# Patient Record
Sex: Female | Born: 1965 | Hispanic: Refuse to answer | Marital: Married | Smoking: Never smoker
Health system: Southern US, Community
[De-identification: ages and names within clinical notes are randomized; demographics above are authoritative.]

## PROBLEM LIST (undated history)

## (undated) DIAGNOSIS — Z87442 Personal history of urinary calculi: Secondary | ICD-10-CM

## (undated) DIAGNOSIS — L309 Dermatitis, unspecified: Secondary | ICD-10-CM

## (undated) DIAGNOSIS — L83 Acanthosis nigricans: Secondary | ICD-10-CM

## (undated) DIAGNOSIS — J069 Acute upper respiratory infection, unspecified: Secondary | ICD-10-CM

## (undated) DIAGNOSIS — K5900 Constipation, unspecified: Secondary | ICD-10-CM

## (undated) DIAGNOSIS — E785 Hyperlipidemia, unspecified: Secondary | ICD-10-CM

## (undated) DIAGNOSIS — T4145XA Adverse effect of unspecified anesthetic, initial encounter: Secondary | ICD-10-CM

## (undated) DIAGNOSIS — N946 Dysmenorrhea, unspecified: Secondary | ICD-10-CM

## (undated) DIAGNOSIS — N201 Calculus of ureter: Secondary | ICD-10-CM

## (undated) DIAGNOSIS — E11628 Type 2 diabetes mellitus with other skin complications: Secondary | ICD-10-CM

## (undated) DIAGNOSIS — J45909 Unspecified asthma, uncomplicated: Secondary | ICD-10-CM

## (undated) DIAGNOSIS — R112 Nausea with vomiting, unspecified: Secondary | ICD-10-CM

## (undated) DIAGNOSIS — R519 Headache, unspecified: Secondary | ICD-10-CM

## (undated) DIAGNOSIS — I2510 Atherosclerotic heart disease of native coronary artery without angina pectoris: Secondary | ICD-10-CM

## (undated) DIAGNOSIS — R51 Headache: Secondary | ICD-10-CM

## (undated) DIAGNOSIS — R05 Cough: Principal | ICD-10-CM

## (undated) HISTORY — DX: Cough: R05

## (undated) HISTORY — DX: Hyperlipidemia, unspecified: E78.5

## (undated) HISTORY — DX: Calculus of ureter: N20.1

## (undated) HISTORY — DX: Acute upper respiratory infection, unspecified: J06.9

## (undated) HISTORY — DX: Dysmenorrhea, unspecified: N94.6

## (undated) HISTORY — DX: Atherosclerotic heart disease of native coronary artery without angina pectoris: I25.10

---

## 2004-03-28 ENCOUNTER — Emergency Department (HOSPITAL_COMMUNITY): Admission: EM | Admit: 2004-03-28 | Discharge: 2004-03-28 | Payer: Self-pay | Admitting: Family Medicine

## 2004-06-20 ENCOUNTER — Emergency Department (HOSPITAL_COMMUNITY): Admission: EM | Admit: 2004-06-20 | Discharge: 2004-06-20 | Payer: Self-pay | Admitting: Family Medicine

## 2004-06-20 ENCOUNTER — Ambulatory Visit (HOSPITAL_COMMUNITY): Admission: RE | Admit: 2004-06-20 | Discharge: 2004-06-20 | Payer: Self-pay | Admitting: Family Medicine

## 2004-09-06 ENCOUNTER — Emergency Department (HOSPITAL_COMMUNITY): Admission: EM | Admit: 2004-09-06 | Discharge: 2004-09-06 | Payer: Self-pay | Admitting: Family Medicine

## 2004-09-10 ENCOUNTER — Emergency Department (HOSPITAL_COMMUNITY): Admission: EM | Admit: 2004-09-10 | Discharge: 2004-09-10 | Payer: Self-pay | Admitting: Family Medicine

## 2005-03-11 ENCOUNTER — Encounter: Admission: RE | Admit: 2005-03-11 | Discharge: 2005-03-11 | Payer: Self-pay | Admitting: Family Medicine

## 2010-07-16 ENCOUNTER — Ambulatory Visit (INDEPENDENT_AMBULATORY_CARE_PROVIDER_SITE_OTHER): Payer: BC Managed Care – PPO | Admitting: Internal Medicine

## 2010-07-16 ENCOUNTER — Encounter: Payer: Self-pay | Admitting: Internal Medicine

## 2010-07-16 VITALS — BP 126/84 | HR 86 | Temp 98.2°F | Ht 64.0 in | Wt 248.4 lb

## 2010-07-16 DIAGNOSIS — R05 Cough: Secondary | ICD-10-CM

## 2010-07-16 MED ORDER — BENZONATATE 100 MG PO CAPS: ORAL_CAPSULE | ORAL | Status: DC

## 2010-07-16 MED ORDER — HYDROCOD POLST-CHLORPHEN POLST 10-8 MG/5ML PO LQCR: 5.0000 mL | Freq: Two times a day (BID) | ORAL | Status: DC

## 2010-07-16 NOTE — Patient Instructions (Signed)
Your cough is likely due to post viral reactive cough, sinus issues, acid reflux and cyclical cough Do  Not take advair For acid reflux - take zegerid 20mg  cap once daily on empty stomach, follow diet sheet (no cola, no spices, no caffeine, no alcohol, no chocolates, no pizza, no fries) For sinus  - take qnasal samples  2 squirts each nostril daily For cyclical cough - take instruction seet and follow it. Use tussionex Followup - 4-6 weeks

## 2010-07-16 NOTE — Progress Notes (Signed)
Subjective:    Patient ID: Tammy Velez, female    DOB: 10/21/1965, 45 y.o.   MRN: [id number]  Cough This is a new (45 year old obese lady. Referred for cough since april 2012) problem. Episode onset: Got sick easter wkeened with cold. 5 days later went to  PMD with cough  - barking, big "bahooka" cough. Got IM steroids and antibiotics. After that it partially resolved 50%. Then in May 2012 (early part) cough got worse and really bad. The problem has been unchanged (Got 2nd round levaquin and prednisone for 7-10 days. CXR early may 2012 repoirtedly clear. Also took advair [employer] day but did not help). The problem occurs hourly. The cough is non-productive (Mostly dry but occ mild brown sputum. Describes barking cough. Rates it as severe. Initially cough bothered during sleep but not anymore). Associated symptoms include heartburn, nasal congestion, rhinorrhea and shortness of breath. Pertinent negatives include no chest pain, chills, ear congestion, ear pain, eye redness, fever, headaches, hemoptysis, myalgias, postnasal drip, rash, sore throat, sweats, weight loss or wheezing. Associated symptoms comments: She sometimes feels like she has to take a deep breath. Feels chest tightness after walking and with cough. Associated occ. feeling of tickle in throat +. Also feels spasm in chest just before cough. Also, occ heartburn since onset of cough. Socially embarrassing at work. Needs to talk a lot at work. Exacerbated by: laughing, talking, activity, walking. Risk factors for lung disease include smoking/tobacco exposure and occupational exposure (Works for [employer] x 2.5 years. 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 She has continued to work during this time. Socially embarrasing). She has tried oral steroids, steroid inhaler, a beta-agonist inhaler, OTC cough suppressant and prescription cough suppressant (has hycodan cough syrup) for the symptoms. The treatment  provided no relief. Her past medical history is significant for bronchitis. There is no history of asthma, bronchiectasis, COPD, emphysema, environmental allergies or pneumonia. Maternal aunt with asthma. Moved here from [place] 2006. Since then fall/winter bronchitis with residual post viral cough that would last 4 weeks. Known  to have eczema but no allergy hx      Review of Systems  Constitutional: Negative for fever, chills, weight loss and unexpected weight change.  HENT: Positive for rhinorrhea. Negative for ear pain, nosebleeds, congestion, sore throat, sneezing, trouble swallowing, dental problem, postnasal drip and sinus pressure.   Eyes: Negative for redness and itching.  Respiratory: Positive for cough and shortness of breath. Negative for hemoptysis, chest tightness and wheezing.   Cardiovascular: Negative for chest pain, palpitations and leg swelling.  Gastrointestinal: Positive for heartburn. Negative for nausea and vomiting.  Genitourinary: Negative for dysuria.  Musculoskeletal: Negative for myalgias and joint swelling.  Skin: Negative for rash.  Neurological: Negative for headaches.  Hematological: Negative for environmental allergies. Does not bruise/bleed easily.  Psychiatric/Behavioral: Negative for dysphoric mood. The patient is not nervous/anxious.        Objective:   Physical Exam  [vitalsreviewed. Constitutional: She is oriented to person, place, and time. She appears well-developed and well-nourished. No distress.       Obese Coughs a lot  HENT:  Head: Normocephalic and atraumatic.  Right Ear: External ear normal.  Left Ear: External ear normal.  Mouth/Throat: Oropharynx is clear and moist. No oropharyngeal exudate.  Eyes: Conjunctivae and EOM are normal. Pupils are equal, round, and reactive to light. Right eye exhibits no discharge. Left eye exhibits no discharge. No scleral icterus.  Neck:  Normal range of motion. Neck supple. No JVD present. No tracheal  deviation present. No thyromegaly present.  Cardiovascular: Normal rate, regular rhythm, normal heart sounds and intact distal pulses.  Exam reveals no gallop and no friction rub.   No murmur heard. Pulmonary/Chest: Effort normal and breath sounds normal. No respiratory distress. She has no wheezes. She has no rales. She exhibits no tenderness.       Coughs with deep breath But no wheeze  Abdominal: Soft. Bowel sounds are normal. She exhibits no distension and no mass. There is no tenderness. There is no rebound and no guarding.  Musculoskeletal: Normal range of motion. She exhibits no edema and no tenderness.  Lymphadenopathy:    She has no cervical adenopathy.  Neurological: She is alert and oriented to person, place, and time. She has normal reflexes. No cranial nerve deficit. She exhibits normal muscle tone. Coordination normal.  Skin: Skin is warm and dry. No rash noted. She is not diaphoretic. No erythema. No pallor.  Psychiatric: She has a normal mood and affect. Her behavior is normal. Judgment and thought content normal.          Assessment & Plan:

## 2010-07-21 ENCOUNTER — Encounter: Payer: Self-pay | Admitting: Internal Medicine

## 2010-07-21 DIAGNOSIS — R05 Cough: Secondary | ICD-10-CM | POA: Insufficient documentation

## 2010-07-21 DIAGNOSIS — R059 Cough, unspecified: Secondary | ICD-10-CM

## 2010-07-21 HISTORY — DX: Cough, unspecified: R05.9

## 2010-07-21 NOTE — Assessment & Plan Note (Signed)
Your cough is likely due to post viral reactive cough, sinus issues, acid reflux and cyclical cough To get rid of it, you have to be 100% compliant with instructions and be patient Do  Not take advair For acid reflux - take zegerid 20mg  cap once daily on empty stomach, follow diet sheet (no cola, no spices, no caffeine, no alcohol, no chocolates, no pizza, no fries) For sinus  - take qnasal samples  2 squirts each nostril daily For cyclical cough - take instruction sheet and follow it. Use tussionex Followup - 4-6 weeks

## 2010-07-23 ENCOUNTER — Telehealth: Payer: Self-pay | Admitting: *Deleted

## 2010-07-23 NOTE — Telephone Encounter (Signed)
error 

## 2010-08-27 ENCOUNTER — Ambulatory Visit: Payer: BC Managed Care – PPO | Admitting: Internal Medicine

## 2010-09-11 ENCOUNTER — Ambulatory Visit (INDEPENDENT_AMBULATORY_CARE_PROVIDER_SITE_OTHER): Payer: BC Managed Care – PPO | Admitting: Internal Medicine

## 2010-09-11 ENCOUNTER — Encounter: Payer: Self-pay | Admitting: Internal Medicine

## 2010-09-11 VITALS — BP 120/80 | HR 104 | Temp 98.9°F | Ht 64.0 in | Wt 253.6 lb

## 2010-09-11 DIAGNOSIS — R05 Cough: Secondary | ICD-10-CM

## 2010-09-11 MED ORDER — GABAPENTIN 300 MG PO CAPS: ORAL_CAPSULE | ORAL | Status: DC

## 2010-09-11 NOTE — Progress Notes (Signed)
Subjective:    Patient ID: Tammy Velez, female    DOB: 1965/05/13, 45 y.o.   MRN: [id number]  HPI Followup multifactorial cough (post viral, sinus, GERD, cyclical cough).  This is 2 months fu afer her first visit when multimodal Rx was started including our cyclical cough protocol. STates that voice rest helped cough 50% but relapsed 2 weeks later so she followed with another voice rest that again helped 50% for another week or so but now coughing back at baseline. Wakes up at night due to cough. Dry cough.  Severity is moderate but occasionally severe. She notices talking at work in [employer] makes cough worse. Voice rest clearly helps. IN addition, zegerid has helped partially esp with heartburn and stomach issues. Also notices that if and when she follows gerd diet it can help somewhat but she is largely non compliant with anti gerd diet. Uses qnasal all along but finally ran out last week; does not think this helped.   Koufman Cough Reflux Index Score is 31 and fits in with LPR cough (hoarse voice, clearing throat, post nasal drip, cough worse lyng down, lump in throat, annoying couh and heartburn)  Review of past hx: reveals hx of eczema in past REview of social: continues to work at [employer]. She is trying to addres with boss about reassignment to a job with less talking Review of family hx: no change  Past Medical History  Diagnosis Date  . Cough      Family History  Problem Relation Age of Onset  . Heart disease Mother     a-fib and MVP     History   Social History  . Marital Status: Married    Spouse Name: N/A    Number of Children: 2  . Years of Education: N/A   Occupational History  . department manager    Social History Main Topics  . Smoking status: Never Smoker   . Smokeless tobacco: Not on file  . Alcohol Use: 1.0 oz/week    2 drink(s) per week  . Drug Use: No  . Sexually Active: Not on file   Other Topics Concern  . Not on file    Social History Narrative  . No narrative on file     No Known Allergies   Outpatient Prescriptions Prior to Visit  Medication Sig Dispense Refill  . omeprazole-sodium bicarbonate (ZEGERID) 40-1100 MG per capsule Take 1 capsule by mouth daily before breakfast.        . chlorpheniramine-HYDROcodone (TUSSIONEX PENNKINETIC ER) 10-8 MG/5ML LQCR Take 5 mLs by mouth every 12 (twelve) hours.  140 mL  0  . NON FORMULARY Place 2 sprays into the nose daily. qNasal       . benzonatate (TESSALON PERLES) 100 MG capsule Take 1-2 tablets every 4 hours  30 capsule  1       Review of Systems  Constitutional: Negative for fever and unexpected weight change.  HENT: Negative for ear pain, nosebleeds, congestion, sore throat, rhinorrhea, sneezing, trouble swallowing, dental problem, postnasal drip and sinus pressure.   Eyes: Negative for redness and itching.  Respiratory: Positive for cough. Negative for chest tightness, shortness of breath and wheezing.   Cardiovascular: Negative for palpitations and leg swelling.  Gastrointestinal: Negative for nausea and vomiting.  Genitourinary: Negative for dysuria.  Musculoskeletal: Negative for joint swelling.  Skin: Negative for rash.  Neurological: Negative for headaches.  Hematological: Does not bruise/bleed easily.  Psychiatric/Behavioral: Negative for dysphoric mood. The patient is  not nervous/anxious.        Objective:   Physical Exam  Vitals reviewed. Constitutional: She is oriented to person, place, and time. She appears well-developed and well-nourished. No distress.       obese  HENT:  Head: Normocephalic and atraumatic.  Right Ear: External ear normal.  Left Ear: External ear normal.  Mouth/Throat: Oropharynx is clear and moist. No oropharyngeal exudate.  Eyes: Conjunctivae and EOM are normal. Pupils are equal, round, and reactive to light. Right eye exhibits no discharge. Left eye exhibits no discharge. No scleral icterus.  Neck: Normal  range of motion. Neck supple. No JVD present. No tracheal deviation present. No thyromegaly present.  Cardiovascular: Normal rate, regular rhythm, normal heart sounds and intact distal pulses.  Exam reveals no gallop and no friction rub.   No murmur heard. Pulmonary/Chest: Effort normal and breath sounds normal. No respiratory distress. She has no wheezes. She has no rales. She exhibits no tenderness.  Abdominal: Soft. Bowel sounds are normal. She exhibits no distension and no mass. There is no tenderness. There is no rebound and no guarding.  Musculoskeletal: Normal range of motion. She exhibits no edema and no tenderness.  Lymphadenopathy:    She has no cervical adenopathy.  Neurological: She is alert and oriented to person, place, and time. She has normal reflexes. No cranial nerve deficit. She exhibits normal muscle tone. Coordination normal.  Skin: Skin is warm and dry. No rash noted. She is not diaphoretic. No erythema. No pallor.  Psychiatric: She has a normal mood and affect. Her behavior is normal. Judgment and thought content normal.          Assessment & Plan:

## 2010-09-11 NOTE — Patient Instructions (Addendum)
-  Cough is from sinus drainage, possible asthma,  possible acid reflux and all conspiring to cause cyclical cough/LPR cough -Need to rule out asthma #Sinus drainage  - start  netti pot daily or atleast 3 times per week: take picture of this from my nurse #Possible Acid Reflux  - continue zegerid 20mg  capsule daily on empty stomach   - if you do not have diet sheet anymore , take diet sheet from [place] - avoid colas, spices, cheeses, spirits, red meats, beer, chocolates, fried foods etc.,   - sleep with head end of bed elevated  - eat small frequent meals  - do not go to bed for 3 hours after last meal #Possible Asthma   - do methacholine challenge test to rule out asthma #Cyclical cough  - will refer you to speech therapy with Mr Verdie Mosher -You can and should try voice rest as and when possible including additional 3 days  - At any time there (not just the 3 days of voice rest but anytime) there is urge to cough, drink water or swallow or sip on throat lozenge  - start neurontin 300mg  po daily  X 3 days, then 300mg  po twice daily x 3 days, then 300mg  po three times daily to continue. If this makes you sleepy, cut down on dose but you should continue neurontin at all times #Followup - 5 weeks r come sooner

## 2010-09-11 NOTE — Assessment & Plan Note (Signed)
-  Cough is from sinus drainage, possible asthma,  possible acid reflux and all conspiring to cause cyclical cough/LPR cough. Score 31, improvement with voice rest and anti-gerd measures and, worsening with talking and other features all fit in with LPR -Need to rule out asthma due to persistence and hx of eczema in past  PLAN #Sinus drainage  - start  netti pot daily or atleast 3 times per week: take picture of this from my nurse - stop qnasal due to lack of perceived beneffit #Possible Acid Reflux (reemphasized)  - continue zegerid 20mg  capsule daily on empty stomach   - if you do not have diet sheet anymore , take diet sheet from [place] - avoid colas, spices, cheeses, spirits, red meats, beer, chocolates, fried foods etc.,   - sleep with head end of bed elevated  - eat small frequent meals  - do not go to bed for 3 hours after last meal #Possible Asthma   - do methacholine challenge test to rule out asthma #Cyclical cough  - will refer you to speech therapy with Mr Verdie Mosher -You can and should try voice rest as and when possible including additional 3 days  - At any time there (not just the 3 days of voice rest but anytime) there is urge to cough, drink water or swallow or sip on throat lozenge  - start neurontin 300mg  po daily  X 3 days, then 300mg  po twice daily x 3 days, then 300mg  po three times daily to continue. If this makes you sleepy, cut down on dose but you should continue neurontin at all times #Followup - 5 weeks

## 2010-10-16 ENCOUNTER — Ambulatory Visit: Payer: BC Managed Care – PPO | Admitting: Internal Medicine

## 2010-10-28 ENCOUNTER — Emergency Department (HOSPITAL_COMMUNITY): Payer: BC Managed Care – PPO

## 2010-10-28 ENCOUNTER — Observation Stay (HOSPITAL_COMMUNITY)
Admission: AD | Admit: 2010-10-28 | Discharge: 2010-10-29 | Disposition: A | Discharge status: Home / Self Care | Payer: BC Managed Care – PPO | Source: Other Acute Inpatient Hospital | Attending: Urology | Admitting: Urology

## 2010-10-28 ENCOUNTER — Emergency Department (HOSPITAL_COMMUNITY)
Admission: EM | Admit: 2010-10-28 | Discharge: 2010-10-28 | Disposition: A | Discharge status: Still In Hospital | Payer: BC Managed Care – PPO | Source: Home / Self Care | Attending: Emergency Medicine | Admitting: Emergency Medicine

## 2010-10-28 DIAGNOSIS — N201 Calculus of ureter: Secondary | ICD-10-CM | POA: Insufficient documentation

## 2010-10-28 DIAGNOSIS — R109 Unspecified abdominal pain: Secondary | ICD-10-CM | POA: Insufficient documentation

## 2010-10-28 DIAGNOSIS — N1330 Unspecified hydronephrosis: Secondary | ICD-10-CM | POA: Insufficient documentation

## 2010-10-28 DIAGNOSIS — E669 Obesity, unspecified: Secondary | ICD-10-CM | POA: Insufficient documentation

## 2010-10-28 DIAGNOSIS — R110 Nausea: Secondary | ICD-10-CM | POA: Insufficient documentation

## 2010-10-28 DIAGNOSIS — N200 Calculus of kidney: Secondary | ICD-10-CM | POA: Insufficient documentation

## 2010-10-28 HISTORY — PX: OTHER SURGICAL HISTORY: SHX169

## 2010-10-28 LAB — URINE MICROSCOPIC-ADD ON

## 2010-10-28 LAB — CBC
Hemoglobin: 12.8 g/dL (ref 12.0–15.0)
MCH: 30.8 pg (ref 26.0–34.0)
MCHC: 34 g/dL (ref 30.0–36.0)
MCV: 90.8 fL (ref 78.0–100.0)
Platelets: 321 10*3/uL (ref 150–400)

## 2010-10-28 LAB — DIFFERENTIAL
Eosinophils Absolute: 0.3 10*3/uL (ref 0.0–0.7)
Eosinophils Relative: 3 % (ref 0–5)
Lymphocytes Relative: 36 % (ref 12–46)
Lymphs Abs: 3.8 10*3/uL (ref 0.7–4.0)
Monocytes Relative: 7 % (ref 3–12)

## 2010-10-28 LAB — URINALYSIS, ROUTINE W REFLEX MICROSCOPIC
Nitrite: POSITIVE — AB
Protein, ur: 100 mg/dL — AB
Specific Gravity, Urine: 1.028 (ref 1.005–1.030)
pH: 6 (ref 5.0–8.0)

## 2010-10-28 LAB — BASIC METABOLIC PANEL
BUN: 18 mg/dL (ref 6–23)
Chloride: 102 mEq/L (ref 96–112)
GFR calc Af Amer: >60 mL/min (ref >60)
Glucose, Bld: 157 mg/dL — ABNORMAL HIGH (ref 70–99)
Sodium: 138 mEq/L (ref 135–145)

## 2010-11-03 NOTE — Op Note (Signed)
  NAMECHELE, CORNELL NO.:  [id number]  MEDICAL RECORD NO.:  [id number]  LOCATION:  1403                         FACILITY:  [facility]  PHYSICIAN:  Valetta Fuller, MD    DATE OF BIRTH:  1965-02-06  DATE OF PROCEDURE:  10/28/2010 DATE OF DISCHARGE:                              OPERATIVE REPORT   PREOPERATIVE DIAGNOSIS:  Right proximal ureteral calculus with probable concurrent urinary tract infection.  POSTOPERATIVE DIAGNOSIS:  Right proximal ureteral calculus with probable concurrent urinary tract infection.  PROCEDURE PERFORMED:  Cystoscopy with right retrograde pyelogram and right double-J stent placement.  SURGEON:  Valetta Fuller, MD  ANESTHESIA:  General.  INDICATIONS:  Ms. Bicking is 45 years of age.  She has had multiple episodes of clinical stone events, but has never required any surgical intervention.  She presented with typical right-sided renal colic.  CT imaging revealed some bilateral renal calculi in a 3 x 5 mm proximal right ureteral stone.  The patient was afebrile and nontoxic in appearance, but did have a urinalysis suspicious for concurrent UTI along with a mildly elevated white blood cell count.  We discussed this with the emergency room physician at [facility] and felt that the prudent thing would be to place double-J stent to assure adequate drainage of the kidney and reduce the risk of developing urosepsis.  We felt that definitive stone management would need to be delayed for another setting.  This was explained to the patient and she was transferred to [facility] Operating Room where again we discussed things with her and her husband.  Our plan is to temporize her situation with double-J stent to continue on antibiotic therapy until cultures are pending.  If she does well clinically, she will be discharged in the morning with definitive stone management in the later date.  TECHNIQUE AND FINDINGS:  The patient was  brought to the operating room where she had successful induction of general anesthesia.  She was placed in the lithotomy position and prepped and draped in the usual manner.  The patient had already received Rocephin IV.  Appropriate surgical time-out was performed.  Cystoscopy was unremarkable.  Right retrograde pyelogram was performed, which demonstrated a filling defect in the proximal ureter with mild obstruction.  A guidewire was placed in right renal pelvis with fluoroscopic guidance.  A 24 cm 6-French double-J stent was then placed.  Good positioning was confirmed and the patient was brought to recovery room in stable condition having had no obvious complications or problems.     Valetta Fuller, MD     DSG/MEDQ  D:  10/28/2010  T:  10/29/2010  Job:  [id number]  Electronically Signed by Barron Alvine M.D. on 11/03/2010 06:48:50 PM

## 2010-11-13 ENCOUNTER — Ambulatory Visit (INDEPENDENT_AMBULATORY_CARE_PROVIDER_SITE_OTHER): Payer: BC Managed Care – PPO | Admitting: Internal Medicine

## 2010-11-13 ENCOUNTER — Encounter: Payer: Self-pay | Admitting: Internal Medicine

## 2010-11-13 VITALS — BP 130/86 | HR 100 | Temp 98.2°F | Ht 64.0 in | Wt 246.4 lb

## 2010-11-13 DIAGNOSIS — R05 Cough: Secondary | ICD-10-CM

## 2010-11-13 DIAGNOSIS — Z23 Encounter for immunization: Secondary | ICD-10-CM

## 2010-11-13 NOTE — Patient Instructions (Addendum)
-  Cough is from sinus drainage, possible asthma,  e acid reflux and all conspiring to cause cyclical cough/LPR cough -Need to rule out asthma - Glad you are  Better - your cough score dropped to 9.5 - Unsure how but glad antibiotics helped - Have flu shot today #Sinus drainage  - continue  netti pot as much as poosible for sinus - I know you do not like it #Acid Reflux  - definitely causative reason for cough  - continue zegerid 20mg  capsule daily on empty stomach   -- avoid colas, spices, cheeses, spirits, red meats, beer, chocolates, fried foods etc.,   - sleep with head end of bed elevated  - eat small frequent meals  - do not go to bed for 3 hours after last meal - follow the diet sheet #Possible Asthma   - do methacholine challenge test to rule out asthma #Cyclical cough  - since you are better without speech therapy and neurontin we can just follow along and see how you do  - so no need for neurontin and speech therapy for now    #Followup - depending on methacholine challenge test

## 2010-11-13 NOTE — Assessment & Plan Note (Signed)
-  Cough is from definite sinus drainage, definite acid reflux and all conspiring to cause cyclical cough/LPR cough - Glad you are  Better - your cough score dropped to 9.5 - Unsure how but glad antibiotics helped  - Still need to rule out asthma - Have flu shot today  #Sinus drainage  - continue  netti pot as much as poosible for sinus - I know you do not like it #Acid Reflux  - definitely causative reason for cough  - continue zegerid 20mg  capsule daily on empty stomach   -- avoid colas, spices, cheeses, spirits, red meats, beer, chocolates, fried foods etc.,   - sleep with head end of bed elevated  - eat small frequent meals  - do not go to bed for 3 hours after last meal - follow the diet sheet #Possible Asthma   - do methacholine challenge test to rule out asthma #Cyclical cough  - since you are better without speech therapy and neurontin we can just follow along and see how you do  - so no need for neurontin and speech therapy for now    #Followup - depending on methacholine challenge test - if normal, dc from scheduled fu. If positive for asthma, then start ICS and then fu  She is agreeable with plan

## 2010-11-13 NOTE — Progress Notes (Signed)
Subjective:    Patient ID: Tammy Velez, female    DOB: 1965/08/21, 45 y.o.   MRN: [id number]  HPI Followup multifactorial cough (post viral, sinus, GERD, cyclical cough).  IOV 09/11/10: This is 2 months fu afer her first visit when multimodal Rx was started including our cyclical cough protocol. STates that voice rest helped cough 50% but relapsed 2 weeks later so she followed with another voice rest that again helped 50% for another week or so but now coughing back at baseline. Wakes up at night due to cough. Dry cough.  Severity is moderate but occasionally severe. She notices talking at work in [employer] makes cough worse. Voice rest clearly helps. IN addition, zegerid has helped partially esp with heartburn and stomach issues. Also notices that if and when she follows gerd diet it can help somewhat but she is largely non compliant with anti gerd diet. Uses qnasal all along but finally ran out last week; does not think this helped.   Koufman Cough Reflux Index Score is 31 and fits in with LPR cough (hoarse voice, clearing throat, post nasal drip, cough worse lyng down, lump in throat, annoying couh and heartburn)  Review of past hx: reveals hx of eczema in past REview of social: continues to work at [employer]. She is trying to addres with boss about reassignment to a job with less talking Review of family hx: no change  FOLLOWUP: -Cough is from sinus drainage, possible asthma,  possible acid reflux and all conspiring to cause cyclical cough/LPR cough -Need to rule out asthma #Sinus drainage  - start  netti pot daily or atleast 3 times per week: take picture of this from my nurse #Possible Acid Reflux  - continue zegerid 20mg  capsule daily on empty stomach   - if you do not have diet sheet anymore , take diet sheet from [place] - avoid colas, spices, cheeses, spirits, red meats, beer, chocolates, fried foods etc.,   - sleep with head end of bed elevated  - eat small frequent  meals  - do not go to bed for 3 hours after last meal #Possible Asthma   - do methacholine challenge test to rule out asthma #Cyclical cough  - will refer you to speech therapy with Mr Verdie Mosher -You can and should try voice rest as and when possible including additional 3 days  - At any time there (not just the 3 days of voice rest but anytime) there is urge to cough, drink water or swallow or sip on throat lozenge  - start neurontin 300mg  po daily  X 3 days, then 300mg  po twice daily x 3 days, then 300mg  po three times daily to continue. If this makes you sleepy, cut down on dose but you should continue neurontin at all times #Followup - 5 weeks or come sooner   OV 11/13/10: . She was taking neurontin and this was helping but was unable to titrate dose due to feeling "depressed". Then several weeks into Rx (10/29/2010) got admitted for uti, ureteral obstn s/p stent and got antibiotics. AFter this cough improved significantly. She feels antibiotics helped improve cough. Now only mild cough. Cough now improved to 9.5 in the RSI Kouffman score. Has only mild hoarseness, clearing of throat, excess mucus, post nasal drop and senssation of lump in throat. Moderate cough after lying down. Cough definitely associated with post nasal drip (though not doing netti pot) GERD and worsens when she does not take PPI. Of note she  did not undergo speech therapy and methacholine challenge but she states she was never called (epic review shows order placed).  Review of Systems Review of Systems  Constitutional: Negative for fever and unexpected weight change.  HENT: Negative for ear pain, nosebleeds, congestion, sore throat, rhinorrhea, sneezing, trouble swallowing, dental problem, postnasal drip and sinus pressure.   Eyes: Negative for redness and itching.  Respiratory: Positive for cough that has improved. Negative for chest tightness, shortness of breath and wheezing.   Cardiovascular: Negative for  palpitations and leg swelling.  Gastrointestinal: Negative for nausea and vomiting.  Genitourinary: Negative for dysuria.  Musculoskeletal: Negative for joint swelling.  Skin: Negative for rash.  Neurological: Negative for headaches.  Hematological: Does not bruise/bleed easily.  Psychiatric/Behavioral: Negative for dysphoric mood. The patient is not nervous/anxious.       Objective:   Physical Exam Vitals reviewed. Constitutional: She is oriented to person, place, and time. She appears well-developed and well-nourished. No distress.       obese Coughs periodically  HENT:  Head: Normocephalic and atraumatic.  Right Ear: External ear normal.  Left Ear: External ear normal.  Mouth/Throat: Oropharynx is clear and moist. No oropharyngeal exudate.  Eyes: Conjunctivae and EOM are normal. Pupils are equal, round, and reactive to light. Right eye exhibits no discharge. Left eye exhibits no discharge. No scleral icterus.  Neck: Normal range of motion. Neck supple. No JVD present. No tracheal deviation present. No thyromegaly present.  Cardiovascular: Normal rate, regular rhythm, normal heart sounds and intact distal pulses.  Exam reveals no gallop and no friction rub.   No murmur heard. Pulmonary/Chest: Effort normal and breath sounds normal. No respiratory distress. She has no wheezes. She has no rales. She exhibits no tenderness.  Abdominal: Soft. Bowel sounds are normal. She exhibits no distension and no mass. There is no tenderness. There is no rebound and no guarding.  Musculoskeletal: Normal range of motion. She exhibits no edema and no tenderness.  Lymphadenopathy:    She has no cervical adenopathy.  Neurological: She is alert and oriented to person, place, and time. She has normal reflexes. No cranial nerve deficit. She exhibits normal muscle tone. Coordination normal.  Skin: Skin is warm and dry. No rash noted. She is not diaphoretic. No erythema. No pallor.  Psychiatric: She has a  normal mood and affect. Her behavior is normal. Judgment and thought content normal.          Assessment & Plan:

## 2010-11-18 ENCOUNTER — Ambulatory Visit (HOSPITAL_COMMUNITY)
Admission: RE | Admit: 2010-11-18 | Discharge: 2010-11-18 | Disposition: A | Discharge status: Home / Self Care | Payer: BC Managed Care – PPO | Source: Ambulatory Visit | Attending: Internal Medicine | Admitting: Internal Medicine

## 2010-11-18 DIAGNOSIS — R05 Cough: Secondary | ICD-10-CM

## 2010-11-18 DIAGNOSIS — R059 Cough, unspecified: Secondary | ICD-10-CM | POA: Insufficient documentation

## 2010-11-22 ENCOUNTER — Telehealth: Payer: Self-pay | Admitting: Internal Medicine

## 2010-11-22 NOTE — Telephone Encounter (Signed)
Called, spoke with pt.  She is requesting the results of Methacholine challenge test done on Oct 15.  I did explain to pt these results can take up to 2 wks to be ready and informed her I would send message to MR to see if they are available yet but he is not back in the office until Monday.  She verbalized understanding and is ok with this.  MR, have you seen these results?  Pls advise.  Thanks!

## 2010-11-24 NOTE — Telephone Encounter (Signed)
It is either not faxed or in my 2 bags of papers. Please have them fax and give it to me in pm when I am in office

## 2010-11-25 NOTE — Telephone Encounter (Signed)
Methacholine challenbge test 11/18/10 positive for asthma. Make fu 1st avail to discuss

## 2010-11-25 NOTE — Telephone Encounter (Signed)
Results received and placed in Mr's to do basket on Side B.

## 2010-11-25 NOTE — Telephone Encounter (Signed)
Called, spoke with Marcelino Duster with Resp Dept.  She will fax results to triage.

## 2010-11-25 NOTE — Discharge Summary (Signed)
  Tammy Velez, RIESEN NO.:  [id number]  MEDICAL RECORD NO.:  [id number]  LOCATION:  1403                         FACILITY:  [facility]  PHYSICIAN:  Valetta Fuller, MD    DATE OF BIRTH:  [birth year]  DATE OF ADMISSION:  10/28/2010 DATE OF DISCHARGE:  10/29/2010                              DISCHARGE SUMMARY   DISCHARGE DIAGNOSIS:  Ureteral calculus.  HISTORY OF PRESENT ILLNESS:  [name] is 45 years of age and has had multiple clinical stone events.  She presented with right-sided renal colic and a CT showed bilateral renal calculi as well as a 5-mm proximal right ureteral stone.  The patient's urinalysis was suspicious for potential concurrent urinary tract infection, although she did not display any evidence of urosepsis.  She did have a mildly elevated white blood cell count.  The patient had initially presented to [facility].  We felt that the most prudent thing to do was to transfer to [facility] and to place a double-J stent.  Right double-J stent was placed on October 28, 2010.  The patient was kept overnight for observation.  She had no evidence of fever, urosepsis, or any other clinical problems.  DISPOSITION:  The patient was discharged to home.  There were no changes in her medications with the exception of the addition of Vesicare as well as some Septra.  The patient will be following up in our office in 10-14 days.     Valetta Fuller, MD     DSG/MEDQ  D:  11/20/2010  T:  11/20/2010  Job:  [id number]  Electronically Signed by Barron Alvine M.D. on 11/25/2010 10:30:10 AM

## 2010-11-26 ENCOUNTER — Encounter: Payer: Self-pay | Admitting: Internal Medicine

## 2010-11-26 ENCOUNTER — Ambulatory Visit (INDEPENDENT_AMBULATORY_CARE_PROVIDER_SITE_OTHER): Payer: BC Managed Care – PPO | Admitting: Internal Medicine

## 2010-11-26 VITALS — BP 138/88 | HR 88 | Temp 98.7°F | Ht 64.0 in | Wt 243.4 lb

## 2010-11-26 DIAGNOSIS — R059 Cough, unspecified: Secondary | ICD-10-CM

## 2010-11-26 DIAGNOSIS — R05 Cough: Secondary | ICD-10-CM

## 2010-11-26 MED ORDER — BECLOMETHASONE DIPROPIONATE 80 MCG/ACT IN AERS: 1.0000 | INHALATION_SPRAY | RESPIRATORY_TRACT | Status: DC | PRN

## 2010-11-26 NOTE — Telephone Encounter (Signed)
Pt is scheduled to come in today at 1:45 to see MR to discuss these results

## 2010-11-26 NOTE — Progress Notes (Signed)
Addended by: Ozella Almond R on: 11/26/2010 02:28 PM   Modules accepted: Orders

## 2010-11-26 NOTE — Patient Instructions (Signed)
-  Cough is from sinus drainage, acid reflux and ASTHMA as seen on methacholine challenge test #Sinus drainage  - continue  netti pot as much as poosible for sinus - I know you do not like it #Acid Reflux  - definitely causative reason for cough  - continue zegerid 20mg  capsule daily on empty stomach   -- avoid colas, spices, cheeses, spirits, red meats, beer, chocolates, fried foods etc.,   - sleep with head end of bed elevated  - eat small frequent meals  - do not go to bed for 3 hours after last meal - follow the diet sheet # Asthma   - START QVAR 2 puff twice daily - take sample, show technique - Use albuterol 2 puff as needed; show tecqnique #Followup -2 months or sooner if needed

## 2010-11-26 NOTE — Progress Notes (Signed)
Subjective:    Patient ID: Tammy Velez, female    DOB: September 11, 1965, 45 y.o.   MRN: [id number]  HPI Followup multifactorial cough (post viral, sinus, GERD, cyclical cough).  IOV 09/11/10: This is 2 months fu afer her first visit when multimodal Rx was started including our cyclical cough protocol. STates that voice rest helped cough 50% but relapsed 2 weeks later so she followed with another voice rest that again helped 50% for another week or so but now coughing back at baseline. Wakes up at night due to cough. Dry cough.  Severity is moderate but occasionally severe. She notices talking at work in [employer] makes cough worse. Voice rest clearly helps. IN addition, zegerid has helped partially esp with heartburn and stomach issues. Also notices that if and when she follows gerd diet it can help somewhat but she is largely non compliant with anti gerd diet. Uses qnasal all along but finally ran out last week; does not think this helped.   Koufman Cough Reflux Index Score is 31 and fits in with LPR cough (hoarse voice, clearing throat, post nasal drip, cough worse lyng down, lump in throat, annoying couh and heartburn)   OV 11/13/10: cough score improved to 9.5. Methacholine challenge ordered  OV 11/25/10: Fu after methacholine challenge test: TEST done this month is strongly poisitive for asthma. Brough on all symptoms. No new symptomatology. RSI cough score is 16. She is wondering if moving to [place] made her develop asthma.    No change to past, family, social hx  Review of Systems  Constitutional: Negative for fever and unexpected weight change.  HENT: Negative for ear pain, nosebleeds, congestion, sore throat, rhinorrhea, sneezing, trouble swallowing, dental problem, postnasal drip and sinus pressure.   Eyes: Negative for redness and itching.  Respiratory: Positive for cough and shortness of breath. Negative for chest tightness and wheezing.   Cardiovascular: Negative for  palpitations and leg swelling.  Gastrointestinal: Negative for nausea and vomiting.  Genitourinary: Negative for dysuria.  Musculoskeletal: Negative for joint swelling.  Skin: Negative for rash.  Neurological: Negative for headaches.  Hematological: Does not bruise/bleed easily.  Psychiatric/Behavioral: Negative for dysphoric mood. The patient is not nervous/anxious.        Objective:   Physical Exam Vitals reviewed. Constitutional: She is oriented to person, place, and time. She appears well-developed and well-nourished. No distress.       obese Coughs periodically  HENT:  Head: Normocephalic and atraumatic.  Right Ear: External ear normal.  Left Ear: External ear normal.  Mouth/Throat: Oropharynx is clear and moist. No oropharyngeal exudate.  Eyes: Conjunctivae and EOM are normal. Pupils are equal, round, and reactive to light. Right eye exhibits no discharge. Left eye exhibits no discharge. No scleral icterus.  Neck: Normal range of motion. Neck supple. No JVD present. No tracheal deviation present. No thyromegaly present.  Cardiovascular: Normal rate, regular rhythm, normal heart sounds and intact distal pulses.  Exam reveals no gallop and no friction rub.   No murmur heard. Pulmonary/Chest: Effort normal and breath sounds normal. No respiratory distress. She has no wheezes. She has no rales. She exhibits no tenderness.  Abdominal: Soft..  Musculoskeletal: Normal range of motion. She exhibits no edema and no tenderness.  Lymphadenopathy:    She has no cervical adenopathy.  Neurological: She is alert and oriented to person, place, and time. She has normal reflexes. No cranial nerve deficit. She exhibits normal muscle tone. Coordination normal.  Skin: Skin is warm and dry.  No rash noted. She is not diaphoretic. No erythema. No pallor.  Psychiatric: She has a normal mood and affect. Her behavior is normal. Judgment and thought content normal.          Assessment & Plan:

## 2010-11-26 NOTE — Assessment & Plan Note (Signed)
Cough is from sinus drainage, acid reflux and ASTHMA as seen on methacholine challenge test #Sinus drainage  - continue  netti pot as much as poosible for sinus - I know you do not like it #Acid Reflux  - definitely causative reason for cough  - continue zegerid 20mg  capsule daily on empty stomach   -- avoid colas, spices, cheeses, spirits, red meats, beer, chocolates, fried foods etc.,   - sleep with head end of bed elevated  - eat small frequent meals  - do not go to bed for 3 hours after last meal - follow the diet sheet # Asthma   - START QVAR 2 puff twice daily - take sample, show technique - Use albuterol 2 puff as needed; show tecqnique #Followup -2 months or sooner if needed

## 2010-12-24 ENCOUNTER — Telehealth: Payer: Self-pay | Admitting: Internal Medicine

## 2010-12-24 NOTE — Telephone Encounter (Signed)
Spoke with Tammy Velez at rehab and she advised appointment had not been scheduled yet and will d/c attempt to schedule. LMOM that rehab has been contacted and advised of MR recs. Nothing further needed at this time.

## 2010-12-24 NOTE — Telephone Encounter (Signed)
Yes that is correct. Please apologize for not removing that order from computer system; on my behalf

## 2010-12-24 NOTE — Telephone Encounter (Signed)
Pt states since original order was placed in Aug12 she has been dx with Asthma and that MR told her the speech therapy is no longer needed. Pt is no longer interested in going. Advised she spoke with Dot Lanes and that # is [phone]. MR please advise if you are okay with this.

## 2011-01-27 ENCOUNTER — Telehealth: Payer: Self-pay | Admitting: *Deleted

## 2011-01-27 NOTE — Telephone Encounter (Signed)
Pt r/s to 02-11-10 at 3:45pm. Carron Curie, CMA

## 2011-01-27 NOTE — Telephone Encounter (Signed)
I LMTCB to r/s the pt appt on 01-30-11 due to MR canceling office. If Tammy Earlene Plater or Victorino Dike is not in the office, please see Lawson Fiscal and have her r/s the patient. She is aware of available slots. Thanks. Carron Curie, CMA

## 2011-01-30 ENCOUNTER — Ambulatory Visit: Payer: BC Managed Care – PPO | Admitting: Internal Medicine

## 2011-02-12 ENCOUNTER — Ambulatory Visit: Payer: BC Managed Care – PPO | Admitting: Internal Medicine

## 2011-03-10 ENCOUNTER — Encounter: Payer: Self-pay | Admitting: Adult Health

## 2011-03-10 ENCOUNTER — Ambulatory Visit (INDEPENDENT_AMBULATORY_CARE_PROVIDER_SITE_OTHER): Payer: BC Managed Care – PPO | Admitting: Adult Health

## 2011-03-10 VITALS — BP 132/84 | HR 100 | Temp 98.0°F | Ht 64.0 in | Wt 255.6 lb

## 2011-03-10 DIAGNOSIS — J069 Acute upper respiratory infection, unspecified: Secondary | ICD-10-CM

## 2011-03-10 HISTORY — DX: Acute upper respiratory infection, unspecified: J06.9

## 2011-03-10 MED ORDER — AZITHROMYCIN 250 MG PO TABS: ORAL_TABLET | ORAL | Status: AC

## 2011-03-10 MED ORDER — HYDROCODONE-HOMATROPINE 5-1.5 MG/5ML PO SYRP: 5.0000 mL | ORAL_SOLUTION | Freq: Four times a day (QID) | ORAL | Status: AC | PRN

## 2011-03-10 NOTE — Patient Instructions (Signed)
Zpack take as directed.  Mucinex DM Twice daily  As needed   Fluids and rest  Hydromet 1-2 tsp every 4 hr As needed  Cough- may make you sleepy.  Please contact office for sooner follow up if symptoms do not improve or worsen or seek emergency care  follow up Dr. Ramaswamy in 6-8 weeks and As needed    

## 2011-03-10 NOTE — Progress Notes (Signed)
Subjective:    Patient ID: Tammy Velez, female    DOB: 02-Oct-1965, 46 y.o.   MRN: [id number]  HPI 46 yo female with known hx of multifactorial cough (post viral, sinus, GERD, cyclical cough).  IOV 09/11/10: This is 2 months fu afer her first visit when multimodal Rx was started including our cyclical cough protocol. STates that voice rest helped cough 50% but relapsed 2 weeks later so she followed with another voice rest that again helped 50% for another week or so but now coughing back at baseline. Wakes up at night due to cough. Dry cough.  Severity is moderate but occasionally severe. She notices talking at work in [employer] makes cough worse. Voice rest clearly helps. IN addition, zegerid has helped partially esp with heartburn and stomach issues. Also notices that if and when she follows gerd diet it can help somewhat but she is largely non compliant with anti gerd diet. Uses qnasal all along but finally ran out last week; does not think this helped.   Koufman Cough Reflux Index Score is 31 and fits in with LPR cough (hoarse voice, clearing throat, post nasal drip, cough worse lyng down, lump in throat, annoying couh and heartburn)   OV 11/13/10: cough score improved to 9.5. Methacholine challenge ordered  OV 11/25/10: Fu after methacholine challenge test: TEST done this month is strongly poisitive for asthma. Brough on all symptoms. No new symptomatology. RSI cough score is 16. She is wondering if moving to [place] made her develop asthma.   03/10/2011 Acute OV  Complains of productive cough with yellow mucus, wheezing, increased SOB, low grade temp x 5 days. Doing well until last few days. OTC not working. Cough is getting worse. Keeping her up at night. No hemoptysis or fever. No chest pain or edema.  No new meds or travel.   Review of Systems Constitutional:   No  weight loss, night sweats,  Fevers, chills, fatigue, or  lassitude.  HEENT:   No headaches,  Difficulty swallowing,   Tooth/dental problems, or  Sore throat,                No sneezing, itching, ear ache, + nasal congestion, post nasal drip,   CV:  No chest pain,  Orthopnea, PND, swelling in lower extremities, anasarca, dizziness, palpitations, syncope.   GI  No heartburn, indigestion, abdominal pain, nausea, vomiting, diarrhea, change in bowel habits, loss of appetite, bloody stools.   Resp:   No coughing up of blood.    No chest wall deformity  Skin: no rash or lesions.  GU: no dysuria, change in color of urine, no urgency or frequency.  No flank pain, no hematuria   MS:  No joint pain or swelling.  No decreased range of motion.  No back pain.  Psych:  No change in mood or affect. No depression or anxiety.  No memory loss.         Objective:   Physical Exam GEN: A/Ox3; pleasant , NAD, well nourished   HEENT:  [place]/AT,  EACs-clear, TMs-wnl, NOSE-clear drainage , THROAT-clear, no lesions, no postnasal drip or exudate noted.   NECK:  Supple w/ fair ROM; no JVD; normal carotid impulses w/o bruits; no thyromegaly or nodules palpated; no lymphadenopathy.  RESP  Coarse BS w/ barking cough,  w/o, wheezes/ rales/ or rhonchi.no accessory muscle use, no dullness to percussion  CARD:  RRR, no m/r/g  , no peripheral edema, pulses intact, no cyanosis or clubbing.  GI:  Soft & nt; nml bowel sounds; no organomegaly or masses detected.  Musco: Warm bil, no deformities or joint swelling noted.   Neuro: alert, no focal deficits noted.    Skin: Warm, no lesions or rashes         Assessment & Plan:

## 2011-03-10 NOTE — Assessment & Plan Note (Signed)
Zpack take as directed.  Mucinex DM Twice daily  As needed   Fluids and rest  Hydromet 1-2 tsp every 4 hr As needed  Cough- may make you sleepy.  Please contact office for sooner follow up if symptoms do not improve or worsen or seek emergency care  follow up Dr. Marchelle Gearing in 6-8 weeks and As needed

## 2011-04-21 ENCOUNTER — Ambulatory Visit: Payer: BC Managed Care – PPO | Admitting: Internal Medicine

## 2011-04-23 ENCOUNTER — Encounter: Payer: Self-pay | Admitting: Internal Medicine

## 2011-04-23 ENCOUNTER — Ambulatory Visit (INDEPENDENT_AMBULATORY_CARE_PROVIDER_SITE_OTHER): Payer: BC Managed Care – PPO | Admitting: Internal Medicine

## 2011-04-23 VITALS — BP 124/82 | HR 83 | Temp 97.2°F | Ht 64.0 in | Wt 257.0 lb

## 2011-04-23 DIAGNOSIS — R05 Cough: Secondary | ICD-10-CM

## 2011-04-23 DIAGNOSIS — R059 Cough, unspecified: Secondary | ICD-10-CM

## 2011-04-23 MED ORDER — FLUTICASONE-SALMETEROL 115-21 MCG/ACT IN AERO: 2.0000 | INHALATION_SPRAY | Freq: Two times a day (BID) | RESPIRATORY_TRACT | Status: DC

## 2011-04-23 MED ORDER — PREDNISONE 10 MG PO TABS: ORAL_TABLET | ORAL | Status: DC

## 2011-04-23 MED ORDER — FLUTICASONE PROPIONATE 50 MCG/ACT NA SUSP: 2.0000 | Freq: Every day | NASAL | Status: DC

## 2011-04-23 MED ORDER — ALBUTEROL SULFATE HFA 108 (90 BASE) MCG/ACT IN AERS
2.0000 | INHALATION_SPRAY | Freq: Four times a day (QID) | RESPIRATORY_TRACT | Status: DC | PRN
Start: 2011-04-23 — End: 2012-04-22

## 2011-04-23 NOTE — Progress Notes (Signed)
Subjective:    Patient ID: Tammy Velez, female    DOB: 12/05/1965, 46 y.o.   MRN: [id number]  HPI 46 yo female with known hx of multifactorial cough (post viral, sinus, GERD, cyclical cough).  IOV 09/11/10: This is 2 months fu afer her first visit when multimodal Rx was started including our cyclical cough protocol. STates that voice rest helped cough 50% but relapsed 2 weeks later so she followed with another voice rest that again helped 50% for another week or so but now coughing back at baseline. Wakes up at night due to cough. Dry cough.  Severity is moderate but occasionally severe. She notices talking at work in [employer] makes cough worse. Voice rest clearly helps. IN addition, zegerid has helped partially esp with heartburn and stomach issues. Also notices that if and when she follows gerd diet it can help somewhat but she is largely non compliant with anti gerd diet. Uses qnasal all along but finally ran out last week; does not think this helped.   Koufman Cough Reflux Index Score is 31 and fits in with LPR cough (hoarse voice, clearing throat, post nasal drip, cough worse lyng down, lump in throat, annoying couh and heartburn)   OV 11/13/10: cough score improved to 9.5. Methacholine challenge ordered  OV 11/25/10: Fu after methacholine challenge test: TEST done this month is strongly poisitive for asthma. Brought on all symptoms. No new symptomatology. RSI cough score is 16. She is wondering if moving to [place] made her develop asthma.   REC  -Cough is from sinus drainage, acid reflux and ASTHMA as seen on methacholine challenge test #Sinus drainage  - continue  netti pot as much as poosible for sinus - I know you do not like it #Acid Reflux  - definitely causative reason for cough  - continue zegerid 20mg  capsule daily on empty stomach   -- avoid colas, spices, cheeses, spirits, red meats, beer, chocolates, fried foods etc.,   - sleep with head end of bed elevated  - eat  small frequent meals  - do not go to bed for 3 hours after last meal - follow the diet sheet # Asthma   - START QVAR 2 puff twice daily - take sample, show technique - Use albuterol 2 puff as needed; show tecqnique #Followup -2 months or sooner if neede  03/10/2011 Acute OV  Complains of productive cough with yellow mucus, wheezing, increased SOB, low grade temp x 5 days. Doing well until last few days. OTC not working. Cough is getting worse. Keeping her up at night. No hemoptysis or fever. No chest pain or edema.  No new meds or travel.    REC Zpack take as directed.  Mucinex DM Twice daily As needed  Fluids and rest  Hydromet 1-2 tsp every 4 hr As needed Cough- may make you sleepy.  Please contact office for sooner follow up if symptoms do not improve or worsen or seek emergency care  follow up Dr. Marchelle Gearing in 6-8 weeks and As needed    OV 04/23/2011 Followup chronic cough on basis of methacholine proven asthma, GERD and LPR  She saw NP 6 weeks ago for acute bronchitis symptoms and treated with zpak. No prednisone Rx. Since then cough not better. Coughs all the time. Feels chest is congested and tight and she needs to take a deep breath. GAgs +. Feels onset of pollen season is resulting in dry eyes, sneezing and runny nose. Post nasal drip, clearing of throat,  and scratchy throat +. Denies fever, wheeze, edema. RSI cough score is worse and 33 and c/w  LPR cough (Level 5 - post nasal drip, annoying cough. Level 4 - clearing of throat, sensation of something in trhoat. Level 3 - hoarseness of voice, choking episodes, and heartburn. 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 Needs to talk a lot.   Current outpatient prescriptions:beclomethasone (QVAR) 80 MCG/ACT inhaler, Inhale 1 puff into the lungs as needed., Disp: 1 Inhaler, Rfl: 12;  omeprazole-sodium bicarbonate (ZEGERID) 40-1100 MG per capsule, Take 1 capsule  by mouth daily before breakfast.  , Disp: , Rfl:     Review of Systems  Constitutional: Negative for fever and unexpected weight change.  HENT: Negative for ear pain, nosebleeds, congestion, sore throat, rhinorrhea, sneezing, trouble swallowing, dental problem, postnasal drip and sinus pressure.   Eyes: Negative for redness and itching.  Respiratory: Positive for cough. Negative for chest tightness, shortness of breath and wheezing.   Cardiovascular: Negative for palpitations and leg swelling.  Gastrointestinal: Negative for nausea and vomiting.  Genitourinary: Negative for dysuria.  Musculoskeletal: Negative for joint swelling.  Skin: Negative for rash.  Neurological: Negative for headaches.  Hematological: Does not bruise/bleed easily.  Psychiatric/Behavioral: Negative for dysphoric mood. The patient is not nervous/anxious.        Objective:   Physical Exam GEN: A/Ox3; pleasant , NAD, well nourished .  Body mass index is 44.11 kg/(m^2).   HEENT:  [place]/AT,  EACs-clear, TMs-wnl, NOSE-clear drainage , THROAT-clear, no lesions, MILD postnasal drip +   NECK:  Supple w/ fair ROM; no JVD; normal carotid impulses w/o bruits; no thyromegaly or nodules palpated; no lymphadenopathy.  RESP  Coarse BS w/ barking cough,  w/o, wheezes/ rales/ or rhonchi.no accessory muscle use, no dullness to percussion  CARD:  RRR, no m/r/g  , no peripheral edema, pulses intact, no cyanosis or clubbing.  GI:   Soft & nt; nml bowel sounds; no organomegaly or masses detected.  Musco: Warm bil, no deformities or joint swelling noted.   Neuro: alert, no focal deficits noted.    Skin: Warm, no lesions or rashes         Assessment & Plan:

## 2011-04-23 NOTE — Patient Instructions (Addendum)
-  Cough is from sinus drainage, acid reflux and ASTHMA - The sinus and asthma appear to be acting up ; we call this exacerbation  #Sinus drainage  -  take generic fluticasone inhaler 2 squirts each nostril daily; take script - if this is not enough add nightly chlorpheniramine 4-8mg  ; can make you very dry  #Acid Reflux  - continue zegerid 20mg  capsule daily on empty stomach   -- avoid colas, spices, cheeses, spirits, red meats, beer, chocolates, fried foods etc.,   - sleep with head end of bed elevated  - eat small frequent meals  - do not go to bed for 3 hours after last meal - follow the diet sheet # Asthma   - Please take Take prednisone 40mg  once daily x 3 days, then 30mg  once daily x 3 days, then 20mg  once daily x 3 days, then prednisone 10mg  once daily  x 3 days and stop - stop qvar - STart Advair HFA 115/21 2 puff twice daily, -  take sample, show technique. Rinse mouth after use - Use albuterol 2 puff as needed; show tecqnique #Followup -4-6 weeks or sooner if needed - at followup do cough score

## 2011-04-23 NOTE — Assessment & Plan Note (Signed)
Cough clearly worse and likely due to post viral reactive cough, LPR cough, sinus drainage and possibly asthma worsening  PLAN -Cough is from sinus drainage, acid reflux and ASTHMA - The sinus and asthma appear to be acting up ; we call this exacerbation  #Sinus drainage  -  take generic fluticasone inhaler 2 squirts each nostril daily; take script - if this is not enough add nightly chlorpheniramine 4-8mg  ; can make you very dry  #Acid Reflux  - continue zegerid 20mg  capsule daily on empty stomach   -- avoid colas, spices, cheeses, spirits, red meats, beer, chocolates, fried foods etc.,   - sleep with head end of bed elevated  - eat small frequent meals  - do not go to bed for 3 hours after last meal - follow the diet sheet # Asthma   - Please take Take prednisone 40mg  once daily x 3 days, then 30mg  once daily x 3 days, then 20mg  once daily x 3 days, then prednisone 10mg  once daily  x 3 days and stop - stop qvar - STart Advair HFA 115/21 2 puff twice daily, -  take sample, show technique. Rinse mouth after use - Use albuterol 2 puff as needed; show tecqnique #Followup -4-6 weeks or sooner if needed - at followup do cough score

## 2011-06-04 ENCOUNTER — Ambulatory Visit: Payer: BC Managed Care – PPO | Admitting: Internal Medicine

## 2011-07-03 ENCOUNTER — Ambulatory Visit: Payer: BC Managed Care – PPO | Admitting: Internal Medicine

## 2011-11-03 ENCOUNTER — Emergency Department (HOSPITAL_COMMUNITY)
Admission: EM | Admit: 2011-11-03 | Discharge: 2011-11-03 | Disposition: A | Discharge status: Home / Self Care | Payer: BC Managed Care – PPO | Attending: Emergency Medicine | Admitting: Emergency Medicine

## 2011-11-03 ENCOUNTER — Emergency Department (HOSPITAL_COMMUNITY): Payer: BC Managed Care – PPO

## 2011-11-03 ENCOUNTER — Encounter (HOSPITAL_COMMUNITY): Payer: Self-pay | Admitting: Emergency Medicine

## 2011-11-03 DIAGNOSIS — R1032 Left lower quadrant pain: Secondary | ICD-10-CM | POA: Insufficient documentation

## 2011-11-03 DIAGNOSIS — E119 Type 2 diabetes mellitus without complications: Secondary | ICD-10-CM | POA: Insufficient documentation

## 2011-11-03 DIAGNOSIS — B379 Candidiasis, unspecified: Secondary | ICD-10-CM | POA: Insufficient documentation

## 2011-11-03 DIAGNOSIS — N200 Calculus of kidney: Secondary | ICD-10-CM | POA: Insufficient documentation

## 2011-11-03 HISTORY — DX: Type 2 diabetes mellitus with other skin complications: E11.628

## 2011-11-03 HISTORY — DX: Type 2 diabetes mellitus with other skin complications: L83

## 2011-11-03 LAB — CBC
HCT: 36.6 % (ref 36.0–46.0)
Hemoglobin: 12.4 g/dL (ref 12.0–15.0)
MCH: 31.1 pg (ref 26.0–34.0)
MCHC: 33.9 g/dL (ref 30.0–36.0)
MCV: 91.7 fL (ref 78.0–100.0)
Platelets: 355 10*3/uL (ref 150–400)
RBC: 3.99 MIL/uL (ref 3.87–5.11)
RDW: 12.5 % (ref 11.5–15.5)
WBC: 10.3 10*3/uL (ref 4.0–10.5)

## 2011-11-03 LAB — BASIC METABOLIC PANEL
BUN: 13 mg/dL (ref 6–23)
CO2: 20 mEq/L (ref 19–32)
Calcium: 9.1 mg/dL (ref 8.4–10.5)
Chloride: 102 mEq/L (ref 96–112)
Creatinine, Ser: 0.87 mg/dL (ref 0.50–1.10)
GFR calc Af Amer: >90 mL/min (ref >90)
GFR calc non Af Amer: 80 mL/min — ABNORMAL LOW (ref >90)
Glucose, Bld: 150 mg/dL — ABNORMAL HIGH (ref 70–99)
Potassium: 3.5 mEq/L (ref 3.5–5.1)
Sodium: 137 mEq/L (ref 135–145)

## 2011-11-03 LAB — URINALYSIS, ROUTINE W REFLEX MICROSCOPIC
Ketones, ur: 15 mg/dL — AB
Nitrite: NEGATIVE
Specific Gravity, Urine: 1.026 (ref 1.005–1.030)
pH: 5.5 (ref 5.0–8.0)

## 2011-11-03 LAB — URINE MICROSCOPIC-ADD ON

## 2011-11-03 LAB — PREGNANCY, URINE: Preg Test, Ur: NEGATIVE

## 2011-11-03 MED ORDER — HYDROMORPHONE HCL PF 1 MG/ML IJ SOLN
1.0000 mg | Freq: Once | INTRAMUSCULAR | Status: AC
  Administered 2011-11-03: 1 mg via INTRAVENOUS
  Filled 2011-11-03: qty 1

## 2011-11-03 MED ORDER — FENTANYL CITRATE 0.05 MG/ML IJ SOLN
50.0000 ug | Freq: Once | INTRAMUSCULAR | Status: AC
  Administered 2011-11-03: 50 ug via INTRAVENOUS
  Filled 2011-11-03: qty 2

## 2011-11-03 MED ORDER — TAMSULOSIN HCL 0.4 MG PO CAPS: 0.4000 mg | ORAL_CAPSULE | Freq: Every day | ORAL | Status: DC

## 2011-11-03 MED ORDER — ONDANSETRON HCL 4 MG/2ML IJ SOLN
4.0000 mg | Freq: Once | INTRAMUSCULAR | Status: AC
  Administered 2011-11-03: 4 mg via INTRAVENOUS
  Filled 2011-11-03: qty 2

## 2011-11-03 MED ORDER — OXYCODONE-ACETAMINOPHEN 5-325 MG PO TABS: 1.0000 | ORAL_TABLET | Freq: Four times a day (QID) | ORAL | Status: DC | PRN

## 2011-11-03 MED ORDER — ONDANSETRON HCL 4 MG PO TABS: 4.0000 mg | ORAL_TABLET | Freq: Four times a day (QID) | ORAL | Status: DC

## 2011-11-03 MED ORDER — FLUCONAZOLE 200 MG PO TABS: 200.0000 mg | ORAL_TABLET | Freq: Every day | ORAL | Status: AC

## 2011-11-03 NOTE — ED Notes (Signed)
Pt c/o severe left flank pain that started roughly an hour ago.  Pt states that it is kidney stones as she has a hx of them.

## 2011-11-03 NOTE — ED Notes (Signed)
Pt aware of need of urine sample.

## 2011-11-03 NOTE — ED Provider Notes (Signed)
History     CSN: [id number]  Arrival date & time 11/03/11  1223   First MD Initiated Contact with Patient 11/03/11 1308      Chief Complaint  Patient presents with  . Nephrolithiasis    (Consider location/radiation/quality/duration/timing/severity/associated sxs/prior treatment) HPI  46 year old female with history of kidney stones presents complaining of left flank pain.  Onset 2 hrs ago, acute, sharp and stabbing, non radiating.  Nothing makes it better or worse.  Pain felt very similar to prior kidney stone.  Has nausea and non bilious non bloody vomits 2/2 pain. Pt tries to urinate but unsuccessful.  Denies fever, chills, cp, sob, abd pain, hematuria, vaginal discharge, or rash.  No recent trauma.  Her Urologist is Dr. Dorise Bullion.    Patient had an abdominal and pelvic CT scan on 10/28/10, showing a 5 mm partially obstructed stone in right kidney with mild hydronephrosis. There are several nonobstructing stones to both kidneys.    Past Medical History  Diagnosis Date  . Cough   . Kidney stones   . Insulin-resistant diabetes mellitus and acanthosis nigricans     Past Surgical History  Procedure Date  . Cesarean section     x2    Family History  Problem Relation Age of Onset  . Heart disease Mother     a-fib and MVP    History  Substance Use Topics  . Smoking status: Never Smoker   . Smokeless tobacco: Not on file  . Alcohol Use: 1.0 oz/week    2 drink(s) per week    OB History    Grav Para Term Preterm Abortions TAB SAB Ect Mult Living                  Review of Systems  All other systems reviewed and are negative.    Allergies  Review of patient's allergies indicates no known allergies.  Home Medications   Current Outpatient Rx  Name Route Sig Dispense Refill  . ALBUTEROL SULFATE HFA 108 (90 BASE) MCG/ACT IN AERS Inhalation Inhale 2 puffs into the lungs every 6 (six) hours as needed for wheezing. 1 Inhaler 2  . FLUTICASONE-SALMETEROL 115-21 MCG/ACT  IN AERO Inhalation Inhale 2 puffs into the lungs 2 (two) times daily. 1 Inhaler 12  . METFORMIN HCL 500 MG PO TABS Oral Take 500 mg by mouth 2 (two) times daily with a meal.    . NORETHINDRONE-ETH ESTRADIOL 1-5 MG-MCG PO TABS Oral Take 1 tablet by mouth daily.      BP 137/68  Pulse 77  Temp 98.5 F (36.9 C) (Oral)  Resp 20  SpO2 100%  LMP 11/01/2011  Physical Exam  Nursing note and vitals reviewed. Constitutional: She is oriented to person, place, and time. She appears well-developed and well-nourished. No distress.       Awake, alert, nontoxic appearance  HENT:  Head: Atraumatic.  Eyes: Conjunctivae normal are normal. Right eye exhibits no discharge. Left eye exhibits no discharge.  Neck: Neck supple.  Cardiovascular: Normal rate and regular rhythm.   Pulmonary/Chest: Effort normal. No respiratory distress. She exhibits no tenderness.  Abdominal: Soft. There is no tenderness. There is no rebound.       Left CVA tenderness to percussion. No overlying skin changes.  Musculoskeletal: She exhibits no tenderness.       ROM appears intact, no obvious focal weakness  Neurological: She is alert and oriented to person, place, and time.       Mental status and motor strength  appears intact  Skin: No rash noted.  Psychiatric: She has a normal mood and affect.    ED Course  Procedures (including critical care time)   Labs Reviewed  CBC  BASIC METABOLIC PANEL   Results for orders placed during the hospital encounter of 11/03/11  CBC      Component Value Range   WBC 10.3  4.0 - 10.5 K/uL   RBC 3.99  3.87 - 5.11 MIL/uL   Hemoglobin 12.4  12.0 - 15.0 g/dL   HCT 16.1  09.6 - 04.5 %   MCV 91.7  78.0 - 100.0 fL   MCH 31.1  26.0 - 34.0 pg   MCHC 33.9  30.0 - 36.0 g/dL   RDW 40.9  81.1 - 91.4 %   Platelets 355  150 - 400 K/uL  BASIC METABOLIC PANEL      Component Value Range   Sodium 137  135 - 145 mEq/L   Potassium 3.5  3.5 - 5.1 mEq/L   Chloride 102  96 - 112 mEq/L   CO2 20   19 - 32 mEq/L   Glucose, Bld 150 (*) 70 - 99 mg/dL   BUN 13  6 - 23 mg/dL   Creatinine, Ser 7.82  0.50 - 1.10 mg/dL   Calcium 9.1  8.4 - 95.6 mg/dL   GFR calc non Af Amer 80 (*) >90 mL/min   GFR calc Af Amer >90  >90 mL/min  URINALYSIS, ROUTINE W REFLEX MICROSCOPIC      Component Value Range   Color, Urine RED (*) YELLOW   APPearance TURBID (*) CLEAR   Specific Gravity, Urine 1.026  1.005 - 1.030   pH 5.5  5.0 - 8.0   Glucose, UA NEGATIVE  NEGATIVE mg/dL   Hgb urine dipstick LARGE (*) NEGATIVE   Bilirubin Urine SMALL (*) NEGATIVE   Ketones, ur 15 (*) NEGATIVE mg/dL   Protein, ur 213 (*) NEGATIVE mg/dL   Urobilinogen, UA 0.2  0.0 - 1.0 mg/dL   Nitrite NEGATIVE  NEGATIVE   Leukocytes, UA SMALL (*) NEGATIVE  PREGNANCY, URINE      Component Value Range   Preg Test, Ur NEGATIVE  NEGATIVE  URINE MICROSCOPIC-ADD ON      Component Value Range   Squamous Epithelial / LPF FEW (*) RARE   WBC, UA 3-6  <3 WBC/hpf   RBC / HPF TOO NUMEROUS TO COUNT  <3 RBC/hpf   Urine-Other MANY YEAST     Ct Abdomen Pelvis Wo Contrast  11/03/2011  *RADIOLOGY REPORT*  Clinical Data: Left-sided pain  CT ABDOMEN AND PELVIS WITHOUT CONTRAST  Technique:  Multidetector CT imaging of the abdomen and pelvis was performed following the standard protocol without intravenous contrast.  Comparison: 10/28/2010  Findings: Lung bases are unremarkable.  Sagittal images of the spine are unremarkable.  Unenhanced liver is unremarkable.  No calcified gallstones are noted within gallbladder.  The unenhanced pancreas, spleen and adrenal glands are unremarkable.  There is nonobstructive calcified calculus in lower pole of the right kidney measures 7 mm. Nonobstructive calcified calculus mid pole of the right kidney measures 3 mm.  There is mild left hydronephrosis and proximal left hydroureter. There is left perinephric stranding and trace left perinephric fluid.  Nonobstructive calcified calculus in the upper pole of the left kidney  measures 3 mm.  There is a tortuous course of proximal left ureter.  In the axial image 44 there is a calcified calculus in proximal left ureter at the L3-L4 disc space  level.  The calculus measures 4.3 mm.  There is subtle nodular contour of the uterus suspicious for myometrial fibroids.  Correlation with pelvic ultrasound is recommended.  Bilateral distal ureter is unremarkable.  No aortic aneurysm.  No small bowel obstruction.  No pericecal inflammation. Normal appendix is partially visualized in axial image 63.  No calcified calculi are noted within urinary bladder.  IMPRESSION:  1.  There is mild left hydronephrosis and proximal left hydroureter.  Significant left perinephric stranding and mild left perinephric fluid. 2.  There is a calcified obstructive calculus in proximal left ureter measures 4.3 mm  at the L3-L4 disc space level. 3.  Bilateral nonobstructive nephrolithiasis. 4.  No calcified calculi are noted within urinary bladder. 5.  No pericecal inflammation.  Normal appendix. 6.  Subtle nodular contour of the uterus.  Myometrial fibroids cannot be excluded.  Clinical correlation is necessary.  Further evaluation with pelvic ultrasound could be performed.   Original Report Authenticated By: Natasha Mead, M.D.      1. L kidney stone 2. Yeast infection  MDM  L flank pain which presents similar to prior kidney stone.  Pt is a reliable historian.  Has CT scan last year which shows several nonobstructed stones to both kidney.  Will check UA, BMP to assess renal function.  Will give pain and antinausea medication.  Will continue care.  The goal is pain control and refer pt to her urologist.  Pt likely will not need further imaging at this time as it may not alter course of treatment.     2:34 PM Continues to endorse pain after receiving Fentanyl and dilaudid 1mg  via IV.  Will continue pain management.  Unable to urinate at this time.  VSS.     4:31 PM Pt felt better, still nauseated but  request gingerale.  UA with moderate Hgb suggestive of kidney stone.  Will give more antinausea.  Will d/c once pt able to tolerates po and able to take PO pain meds.    5:02 PM Pt sts pain is worsen and currently vomiting.  Pain not well controlled with current treatment.  Plan to obtain basic labs, abd/pelvis CT and will consider consult urology for further management.  Discussed with my attending who agrees with plan.    7:01 PM Patient felt much better. She is able to tolerate by mouth. She requested to be discharge. Patient will be given pain medication, antinausea medication, and Flomax with strict followup instruction with all urologist.  Pt voice understanding and agrees with plan.    BP 149/83  Pulse 63  Temp 97.8 F (36.6 C) (Oral)  Resp 16  SpO2 95%  LMP 11/01/2011   time spent personally by me on the following activities: Review prior charts, imaging, and labs.  Development of treatment plan with patient and/or surrogate as well as nursing, discussions with consultants, evaluation of patient's response to treatment, examination of patient, obtaining history from patient or surrogate, ordering and performing treatments and interventions, ordering and review of laboratory studies, ordering and review of radiographic studies, pulse oximetry and re-evaluation of patient's condition.   Fayrene Helper, PA-C 11/03/11 1905

## 2011-11-03 NOTE — ED Notes (Signed)
Discharge instructions reviewed w/ pt., verbalizes understanding. Four prescriptions provided at discharge. Urine strainer, specimen cup, and hat provided at discharge. Pt understands how to use same

## 2011-11-03 NOTE — ED Notes (Signed)
Pt still in a lot of pain, PA Bowie informed, PA Greta Doom will come to evaluate patient.

## 2011-11-03 NOTE — ED Notes (Signed)
Pt is aware that a urine sample is needed. Pt stated that she did not have the urge to go right now, but that she would let us know when she needed to.

## 2011-11-05 ENCOUNTER — Ambulatory Visit (HOSPITAL_BASED_OUTPATIENT_CLINIC_OR_DEPARTMENT_OTHER): Payer: BC Managed Care – PPO | Admitting: Anesthesiology

## 2011-11-05 ENCOUNTER — Ambulatory Visit (HOSPITAL_BASED_OUTPATIENT_CLINIC_OR_DEPARTMENT_OTHER)
Admission: RE | Admit: 2011-11-05 | Discharge: 2011-11-05 | Disposition: A | Discharge status: Home / Self Care | Payer: BC Managed Care – PPO | Source: Ambulatory Visit | Attending: Urology | Admitting: Urology

## 2011-11-05 ENCOUNTER — Encounter (HOSPITAL_BASED_OUTPATIENT_CLINIC_OR_DEPARTMENT_OTHER): Payer: Self-pay | Admitting: *Deleted

## 2011-11-05 ENCOUNTER — Encounter (HOSPITAL_BASED_OUTPATIENT_CLINIC_OR_DEPARTMENT_OTHER): Payer: Self-pay | Admitting: Anesthesiology

## 2011-11-05 ENCOUNTER — Encounter (HOSPITAL_BASED_OUTPATIENT_CLINIC_OR_DEPARTMENT_OTHER)
Admission: RE | Disposition: A | Discharge status: Home / Self Care | Payer: Self-pay | Source: Ambulatory Visit | Attending: Urology

## 2011-11-05 ENCOUNTER — Other Ambulatory Visit: Payer: Self-pay | Admitting: Urology

## 2011-11-05 DIAGNOSIS — N201 Calculus of ureter: Secondary | ICD-10-CM | POA: Insufficient documentation

## 2011-11-05 DIAGNOSIS — T8859XA Other complications of anesthesia, initial encounter: Secondary | ICD-10-CM

## 2011-11-05 DIAGNOSIS — E119 Type 2 diabetes mellitus without complications: Secondary | ICD-10-CM | POA: Insufficient documentation

## 2011-11-05 DIAGNOSIS — K219 Gastro-esophageal reflux disease without esophagitis: Secondary | ICD-10-CM | POA: Insufficient documentation

## 2011-11-05 DIAGNOSIS — J45909 Unspecified asthma, uncomplicated: Secondary | ICD-10-CM | POA: Insufficient documentation

## 2011-11-05 HISTORY — DX: Calculus of ureter: N20.1

## 2011-11-05 HISTORY — DX: Unspecified asthma, uncomplicated: J45.909

## 2011-11-05 HISTORY — PX: CYSTOSCOPY WITH URETEROSCOPY: SHX5123

## 2011-11-05 HISTORY — DX: Dermatitis, unspecified: L30.9

## 2011-11-05 HISTORY — DX: Other complications of anesthesia, initial encounter: T88.59XA

## 2011-11-05 HISTORY — DX: Personal history of urinary calculi: Z87.442

## 2011-11-05 HISTORY — DX: Nausea with vomiting, unspecified: R11.2

## 2011-11-05 SURGERY — CYSTOSCOPY WITH URETEROSCOPY
Anesthesia: General | Laterality: Left | Wound class: Clean Contaminated

## 2011-11-05 MED ORDER — PROMETHAZINE HCL 25 MG/ML IJ SOLN: 6.2500 mg | INTRAMUSCULAR | Status: DC | PRN

## 2011-11-05 MED ORDER — GLYCOPYRROLATE 0.2 MG/ML IJ SOLN
INTRAMUSCULAR | Status: DC | PRN
  Administered 2011-11-05: 0.4 mg via INTRAVENOUS

## 2011-11-05 MED ORDER — SODIUM CHLORIDE 0.9 % IR SOLN
Status: DC | PRN
  Administered 2011-11-05: 6000 mL via INTRAVESICAL

## 2011-11-05 MED ORDER — MEPERIDINE HCL 25 MG/ML IJ SOLN: 6.2500 mg | INTRAMUSCULAR | Status: DC | PRN

## 2011-11-05 MED ORDER — KETOROLAC TROMETHAMINE 30 MG/ML IJ SOLN
INTRAMUSCULAR | Status: DC | PRN
  Administered 2011-11-05: 30 mg via INTRAVENOUS

## 2011-11-05 MED ORDER — ROCURONIUM BROMIDE 100 MG/10ML IV SOLN
INTRAVENOUS | Status: DC | PRN
  Administered 2011-11-05: 30 mg via INTRAVENOUS
  Administered 2011-11-05: 20 mg via INTRAVENOUS

## 2011-11-05 MED ORDER — IOHEXOL 350 MG/ML SOLN
INTRAVENOUS | Status: DC | PRN
  Administered 2011-11-05: 5 mL via INTRAVENOUS

## 2011-11-05 MED ORDER — PROPOFOL 10 MG/ML IV BOLUS
INTRAVENOUS | Status: DC | PRN
  Administered 2011-11-05: 50 mg via INTRAVENOUS
  Administered 2011-11-05: 250 mg via INTRAVENOUS

## 2011-11-05 MED ORDER — FENTANYL CITRATE 0.05 MG/ML IJ SOLN
INTRAMUSCULAR | Status: DC | PRN
  Administered 2011-11-05: 100 ug via INTRAVENOUS
  Administered 2011-11-05 (×4): 50 ug via INTRAVENOUS

## 2011-11-05 MED ORDER — ONDANSETRON HCL 4 MG/2ML IJ SOLN
INTRAMUSCULAR | Status: DC | PRN
  Administered 2011-11-05: 4 mg via INTRAVENOUS

## 2011-11-05 MED ORDER — CIPROFLOXACIN IN D5W 400 MG/200ML IV SOLN
400.0000 mg | INTRAVENOUS | Status: AC
  Administered 2011-11-05: 400 mg via INTRAVENOUS

## 2011-11-05 MED ORDER — METOCLOPRAMIDE HCL 5 MG/ML IJ SOLN
INTRAMUSCULAR | Status: DC | PRN
  Administered 2011-11-05: 10 mg via INTRAVENOUS

## 2011-11-05 MED ORDER — OXYCODONE HCL 5 MG/5ML PO SOLN: 5.0000 mg | Freq: Once | ORAL | Status: DC | PRN

## 2011-11-05 MED ORDER — MIDAZOLAM HCL 5 MG/5ML IJ SOLN
INTRAMUSCULAR | Status: DC | PRN
  Administered 2011-11-05: 2 mg via INTRAVENOUS

## 2011-11-05 MED ORDER — DEXAMETHASONE SODIUM PHOSPHATE 4 MG/ML IJ SOLN
INTRAMUSCULAR | Status: DC | PRN
  Administered 2011-11-05: 10 mg via INTRAVENOUS

## 2011-11-05 MED ORDER — NEOSTIGMINE METHYLSULFATE 1 MG/ML IJ SOLN
INTRAMUSCULAR | Status: DC | PRN
  Administered 2011-11-05: 4 mg via INTRAVENOUS
  Administered 2011-11-05: 2 mg via INTRAVENOUS
  Administered 2011-11-05: 1 mg via INTRAVENOUS

## 2011-11-05 MED ORDER — LIDOCAINE HCL 2 % EX GEL
CUTANEOUS | Status: DC | PRN
  Administered 2011-11-05: 1

## 2011-11-05 MED ORDER — LACTATED RINGERS IV SOLN
INTRAVENOUS | Status: DC
  Administered 2011-11-05: 10:00:00 via INTRAVENOUS

## 2011-11-05 MED ORDER — HYDROMORPHONE HCL PF 1 MG/ML IJ SOLN: 0.2500 mg | INTRAMUSCULAR | Status: DC | PRN

## 2011-11-05 MED ORDER — SUCCINYLCHOLINE CHLORIDE 20 MG/ML IJ SOLN
INTRAMUSCULAR | Status: DC | PRN
  Administered 2011-11-05: 160 mg via INTRAVENOUS

## 2011-11-05 MED ORDER — LIDOCAINE HCL (CARDIAC) 20 MG/ML IV SOLN
INTRAVENOUS | Status: DC | PRN
  Administered 2011-11-05: 100 mg via INTRAVENOUS

## 2011-11-05 MED ORDER — ACETAMINOPHEN 10 MG/ML IV SOLN: 1000.0000 mg | Freq: Once | INTRAVENOUS | Status: DC | PRN

## 2011-11-05 MED ORDER — OXYCODONE-ACETAMINOPHEN 5-500 MG PO CAPS: 1.0000 | ORAL_CAPSULE | ORAL | Status: DC | PRN

## 2011-11-05 MED ORDER — PROMETHAZINE HCL 25 MG/ML IJ SOLN
6.2500 mg | Freq: Once | INTRAMUSCULAR | Status: AC | PRN
  Administered 2011-11-05: 6.25 mg via INTRAVENOUS

## 2011-11-05 MED ORDER — LACTATED RINGERS IV SOLN
INTRAVENOUS | Status: DC | PRN
  Administered 2011-11-05 (×4): via INTRAVENOUS
  Administered 2011-11-05: 10:00:00

## 2011-11-05 MED ORDER — OXYCODONE HCL 5 MG PO TABS: 5.0000 mg | ORAL_TABLET | Freq: Once | ORAL | Status: DC | PRN

## 2011-11-05 SURGICAL SUPPLY — 34 items
ADAPTER CATH URET PLST 4-6FR (CATHETERS) ×2 IMPLANT
ADPR CATH URET STRL DISP 4-6FR (CATHETERS) ×1
BAG DRAIN URO-CYSTO SKYTR STRL (DRAIN) ×2 IMPLANT
BASKET LASER NITINOL 1.9FR (BASKET) IMPLANT
BASKET STNLS GEMINI 4WIRE 3FR (BASKET) IMPLANT
BASKET ZERO TIP NITINOL 2.4FR (BASKET) ×2 IMPLANT
BENZOIN TINCTURE PRP APPL 2/3 (GAUZE/BANDAGES/DRESSINGS) IMPLANT
BSKT STON RTRVL 120 1.9FR (BASKET)
BSKT STON RTRVL GEM 120X11 3FR (BASKET)
BSKT STON RTRVL ZERO TP 2.4FR (BASKET) ×1
CANISTER SUCT LVC 12 LTR MEDI- (MISCELLANEOUS) ×2 IMPLANT
CATH INTERMIT  6FR 70CM (CATHETERS) IMPLANT
CATH URET 5FR 28IN CONE TIP (BALLOONS)
CATH URET 5FR 28IN OPEN ENDED (CATHETERS) ×2 IMPLANT
CATH URET 5FR 70CM CONE TIP (BALLOONS) IMPLANT
CLOTH BEACON ORANGE TIMEOUT ST (SAFETY) ×2 IMPLANT
DRAPE CAMERA CLOSED 9X96 (DRAPES) ×2 IMPLANT
DRSG TEGADERM 2-3/8X2-3/4 SM (GAUZE/BANDAGES/DRESSINGS) IMPLANT
GLOVE BIO SURGEON STRL SZ7.5 (GLOVE) ×2 IMPLANT
GOWN STRL REIN XL XLG (GOWN DISPOSABLE) ×2 IMPLANT
GUIDEWIRE 0.038 PTFE COATED (WIRE) IMPLANT
GUIDEWIRE ANG ZIPWIRE 038X150 (WIRE) IMPLANT
GUIDEWIRE STR DUAL SENSOR (WIRE) ×2 IMPLANT
IV NS IRRIG 3000ML ARTHROMATIC (IV SOLUTION) ×2 IMPLANT
KIT BALLIN UROMAX 15FX10 (LABEL) IMPLANT
KIT BALLN UROMAX 15FX4 (MISCELLANEOUS) IMPLANT
KIT BALLN UROMAX 26 75X4 (MISCELLANEOUS)
LASER FIBER DISP (UROLOGICAL SUPPLIES) ×2 IMPLANT
PACK CYSTOSCOPY (CUSTOM PROCEDURE TRAY) ×2 IMPLANT
SET HIGH PRES BAL DIL (LABEL)
SHEATH ACCESS URETERAL 38CM (SHEATH) ×2 IMPLANT
SHEATH ACCESS URETERAL 54CM (SHEATH) IMPLANT
STENT CONTOUR 6FRX24X.038 (STENTS) IMPLANT
STENT URET 6FRX24 CONTOUR (STENTS) ×2 IMPLANT

## 2011-11-05 NOTE — Transfer of Care (Signed)
Immediate Anesthesia Transfer of Care Note  Patient: Tammy Velez  Procedure(s) Performed: Procedure(s) (LRB): CYSTOSCOPY WITH URETEROSCOPY (Left)  Patient Location: PACU  Anesthesia Type: General  Level of Consciousness: awake, sedated, patient cooperative and responds to stimulation  Airway & Oxygen Therapy: Patient Spontanous Breathing and Patient connected to face mask oxygen  Post-op Assessment: Report given to PACU RN, Post -op Vital signs reviewed and stable and Patient moving all extremities  Post vital signs: Reviewed and stable  Complications: No apparent anesthesia complications

## 2011-11-05 NOTE — Anesthesia Procedure Notes (Signed)
Procedure Name: Intubation Date/Time: 11/05/2011 11:24 AM Performed by: Jessica Priest Pre-anesthesia Checklist: Patient identified, Emergency Drugs available, Suction available and Patient being monitored Patient Re-evaluated:Patient Re-evaluated prior to inductionOxygen Delivery Method: Circle System Utilized Preoxygenation: Pre-oxygenation with 100% oxygen Intubation Type: IV induction, Cricoid Pressure applied and Rapid sequence Ventilation: Mask ventilation without difficulty Laryngoscope Size: Mac and 4 Tube type: Oral Tube size: 8.0 mm Number of attempts: 1 Airway Equipment and Method: stylet and oral airway Placement Confirmation: ETT inserted through vocal cords under direct vision,  positive ETCO2 and breath sounds checked- equal and bilateral Secured at: 23 cm Tube secured with: Tape Dental Injury: Teeth and Oropharynx as per pre-operative assessment

## 2011-11-05 NOTE — Anesthesia Postprocedure Evaluation (Signed)
Anesthesia Post Note  Patient: Tammy Velez  Procedure(s) Performed: Procedure(s) (LRB): CYSTOSCOPY WITH URETEROSCOPY (Left)  Anesthesia type: General  Patient location: PACU  Post pain: Pain level controlled  Post assessment: Post-op Vital signs reviewed  Last Vitals: BP 130/70  Pulse 81  Temp 36.2 C (Oral)  Resp 24  Ht 5\' 4"  (1.626 m)  Wt 252 lb 8 oz (114.533 kg)  BMI 43.34 kg/m2  SpO2 90%  LMP 11/01/2011  Post vital signs: Reviewed  Level of consciousness: sedated  Complications: Pt re-intubated in OR due to residual muscle weakness. No recall. No other apparent anesthesia complications

## 2011-11-05 NOTE — Anesthesia Preprocedure Evaluation (Addendum)
Anesthesia Evaluation  Patient identified by MRN, date of birth, ID band Patient awake    Reviewed: Allergy & Precautions, H&P , NPO status , Patient's Chart, lab work & pertinent test results  History of Anesthesia Complications Negative for: history of anesthetic complications  Airway Mallampati: II TM Distance: >3 FB Neck ROM: Full    Dental  (+) Dental Advisory Given and Teeth Intact   Pulmonary asthma ,  breath sounds clear to auscultation  Pulmonary exam normal       Cardiovascular - CAD, - Past MI and - CHF Rhythm:Regular Rate:Normal     Neuro/Psych    GI/Hepatic Neg liver ROS, GERD-  Medicated,  Endo/Other  diabetes, Type 2, Oral Hypoglycemic AgentsMorbid obesity  Renal/GU Renal disease     Musculoskeletal   Abdominal (+) + obese,   Peds  Hematology negative hematology ROS (+)   Anesthesia Other Findings   Reproductive/Obstetrics                        Anesthesia Physical Anesthesia Plan  ASA: III  Anesthesia Plan: General   Post-op Pain Management:    Induction: Intravenous  Airway Management Planned: Oral ETT  Additional Equipment:   Intra-op Plan:   Post-operative Plan: Extubation in OR  Informed Consent: I have reviewed the patients History and Physical, chart, labs and discussed the procedure including the risks, benefits and alternatives for the proposed anesthesia with the patient or authorized representative who has indicated his/her understanding and acceptance.   Dental advisory given  Plan Discussed with: CRNA  Anesthesia Plan Comments:         Anesthesia Quick Evaluation

## 2011-11-05 NOTE — Op Note (Signed)
Preoperative diagnosis: Left ureteral calculus  Postoperative diagnosis: Left ureteral calculus  Procedure:  1. Cystoscopy 2. left ureteroscopy and stone removal 3. Ureteroscopic laser lithotripsy 4. left ureteral stent placement (24cm) 30F 5. left retrograde pyelography with interpretation  Surgeon: Valetta Fuller, MD  Anesthesia: General  Complications: None  Intraoperative findings: left retrograde pyelography demonstrated a filling defect within the left mid ureter consistent with the patient's known calculus without other abnormalities.  EBL: Minimal  Specimens: 1. Fragment left ureteral calculus  Disposition of specimens: [facility] for stone analysis  Indication: Tammy Velez is a 46 y.o.   patient with urolithiasis. After reviewing the management options for treatment, the patient elected to proceed with the above surgical procedure(s). We have discussed the potential benefits and risks of the procedure, side effects of the proposed treatment, the likelihood of the patient achieving the goals of the procedure, and any potential problems that might occur during the procedure or recuperation. Informed consent has been obtained.  Description of procedure:  The patient was taken to the operating room and general anesthesia was induced.  The patient was placed in the dorsal lithotomy position, prepped and draped in the usual sterile fashion, and preoperative antibiotics were administered. A preoperative time-out was performed.   Cystourethroscopy was performed. The bladder was then systematically examined in its entirety. There was no evidence for any bladder tumors, stones, or other mucosal pathology.    Attention then turned to the left ureteral orifice and a ureteral catheter was used to intubate the ureteral orifice.  Omnipaque contrast was injected through the ureteral catheter and a retrograde pyelogram was performed with findings as dictated  above.  A 0.38 sensor guidewire was then advanced up the left ureter into the renal pelvis under fluoroscopic guidance. The 6 Fr semirigid ureteroscope was then advanced into the ureter next to the guidewire and the calculus was identified.   The stone was then fragmented with the 365 micron holmium laser fiber on a setting of 0.5J and frequency of 8 Hz.   All stones were then removed from the ureter with a zero tip nitinol basket.  Reinspection of the ureter revealed no remaining visible stones or fragments.   The wire was then backloaded through the cystoscope and a ureteral stent was advance over the wire using Seldinger technique.  The stent was positioned appropriately under fluoroscopic and cystoscopic guidance.  The wire was then removed with an adequate stent curl noted in the renal pelvis as well as in the bladder.  The bladder was then emptied and the procedure ended.  The patient appeared to tolerate the procedure well and without complications.  The patient was able to be awakened and transferred to the recovery unit in satisfactory condition.

## 2011-11-06 ENCOUNTER — Encounter (HOSPITAL_BASED_OUTPATIENT_CLINIC_OR_DEPARTMENT_OTHER): Payer: Self-pay | Admitting: Urology

## 2011-11-06 NOTE — ED Provider Notes (Signed)
Medical screening examination/treatment/procedure(s) were performed by non-physician practitioner and as supervising physician I was immediately available for consultation/collaboration.  Raeford Razor, MD 11/06/11 1100

## 2011-11-11 ENCOUNTER — Encounter (HOSPITAL_BASED_OUTPATIENT_CLINIC_OR_DEPARTMENT_OTHER): Payer: Self-pay

## 2011-11-18 ENCOUNTER — Other Ambulatory Visit: Payer: Self-pay | Admitting: Urology

## 2011-12-24 ENCOUNTER — Encounter (HOSPITAL_COMMUNITY): Payer: Self-pay | Admitting: *Deleted

## 2011-12-24 NOTE — Pre-Procedure Instructions (Signed)
Asked to bring blue folder the day of the procedure,insurance card,I.D. driver's license,wear comfortable clothing and have a driver for the day. Asked not to take Advil,Motrin,Ibuprofen,Aleve or any NSAIDS, Aspirin, or Toradol for 72 hours prior to procedure,  No vitamins or herbal medications 7 days prior to procedure. Instructed to take laxative per doctor's office instructions and eat a light dinner the evening before procedure.   To arrive at 0530  for lithotripsy procedure. 

## 2012-01-07 ENCOUNTER — Encounter (HOSPITAL_COMMUNITY): Payer: Self-pay | Admitting: Pharmacy Technician

## 2012-01-09 NOTE — H&P (Signed)
  History of Present Illness  Tammy Velez presents today approximately 2 months status post her ureteroscopy with holmium laser lithotripsy to treat a 5 mm left ureteral calculus.  On recent CT imaging the patient was also noted to have a 7 mm lower pole right stone and 3 mm midpole stone both on the right side. She is interested in consideration for elective lithotripsy.   Vitals Vital Signs [Data Includes: Last 1 Day]   Blood Pressure: 115 / 82 Temperature: 98.4 F Heart Rate: 100  Physical Exam Constitutional:. In no acute distress.  ENT:. The ears and nose are normal in appearance.  Neck: The appearance of the neck is normal and no neck mass is present.  Pulmonary: No respiratory distress and normal respiratory rhythm and effort.  Cardiovascular: Heart rate and rhythm are normal . No peripheral edema.  Abdomen: The abdomen is soft and nontender. No masses are palpated. No CVA tenderness. No hernias are palpable. No hepatosplenomegaly noted.  Skin: Normal skin turgor, no visible rash and no visible skin lesions.   Assessment Assessed  1. Nephrolithiasis 592.0 2. Ureteral Stone 592.1  Plan Health Maintenance (V70.0)  1. UA With REFLEX  Done: 10Oct2013 09:21AM Nephrolithiasis (592.0)  2. Follow-up Schedule Surgery Office  Follow-up  Requested for: 10Oct2013 Ureteral Stone (592.1)  3. Cysto Removal JJ(s)  Done: 10Oct2013  Discussion/Summary  Tammy Velez has had what appears to be successful ureteroscopy and treatment of a left ureteral calculus. She has had several clinical stone events of last year. She is interested in proactively treated in the right-sided renal calculi which I think is reasonable Tammy Velez. A 3 mm stone may be difficult to see and treat but we certainly can treat the 7 mm stone in the lower pole of her right kidney. She appears understand the advantages and disadvantages of going for prophylactic treatment in this Velez.

## 2012-01-12 ENCOUNTER — Ambulatory Visit (HOSPITAL_COMMUNITY): Payer: BC Managed Care – PPO

## 2012-01-12 ENCOUNTER — Encounter (HOSPITAL_COMMUNITY): Payer: Self-pay | Admitting: *Deleted

## 2012-01-12 ENCOUNTER — Ambulatory Visit (HOSPITAL_COMMUNITY)
Admission: RE | Admit: 2012-01-12 | Discharge: 2012-01-12 | Disposition: A | Discharge status: Home / Self Care | Payer: BC Managed Care – PPO | Source: Ambulatory Visit | Attending: Urology | Admitting: Urology

## 2012-01-12 ENCOUNTER — Encounter (HOSPITAL_COMMUNITY)
Admission: RE | Disposition: A | Discharge status: Home / Self Care | Payer: Self-pay | Source: Ambulatory Visit | Attending: Urology

## 2012-01-12 DIAGNOSIS — E119 Type 2 diabetes mellitus without complications: Secondary | ICD-10-CM | POA: Insufficient documentation

## 2012-01-12 DIAGNOSIS — J45909 Unspecified asthma, uncomplicated: Secondary | ICD-10-CM | POA: Insufficient documentation

## 2012-01-12 DIAGNOSIS — N200 Calculus of kidney: Secondary | ICD-10-CM | POA: Insufficient documentation

## 2012-01-12 DIAGNOSIS — E669 Obesity, unspecified: Secondary | ICD-10-CM | POA: Insufficient documentation

## 2012-01-12 SURGERY — LITHOTRIPSY, ESWL
Anesthesia: LOCAL | Laterality: Right

## 2012-01-12 MED ORDER — DEXTROSE-NACL 5-0.45 % IV SOLN
INTRAVENOUS | Status: DC
  Administered 2012-01-12: 07:00:00 via INTRAVENOUS

## 2012-01-12 MED ORDER — DIAZEPAM 5 MG PO TABS
10.0000 mg | ORAL_TABLET | ORAL | Status: AC
  Administered 2012-01-12: 10 mg via ORAL
  Filled 2012-01-12: qty 2

## 2012-01-12 MED ORDER — KETOROLAC TROMETHAMINE 30 MG/ML IJ SOLN
30.0000 mg | Freq: Once | INTRAMUSCULAR | Status: AC
  Administered 2012-01-12: 30 mg via INTRAVENOUS
  Filled 2012-01-12: qty 1

## 2012-01-12 MED ORDER — CIPROFLOXACIN IN D5W 400 MG/200ML IV SOLN
400.0000 mg | INTRAVENOUS | Status: AC
  Administered 2012-01-12: 400 mg via INTRAVENOUS
  Filled 2012-01-12: qty 200

## 2012-01-12 MED ORDER — DIPHENHYDRAMINE HCL 25 MG PO CAPS
25.0000 mg | ORAL_CAPSULE | ORAL | Status: AC
  Administered 2012-01-12: 25 mg via ORAL
  Filled 2012-01-12: qty 1

## 2012-01-12 NOTE — Op Note (Signed)
See [facility] OP note scanned into chart. 

## 2012-01-22 ENCOUNTER — Encounter: Payer: Self-pay | Admitting: Internal Medicine

## 2012-01-22 ENCOUNTER — Ambulatory Visit (INDEPENDENT_AMBULATORY_CARE_PROVIDER_SITE_OTHER): Payer: BC Managed Care – PPO | Admitting: Internal Medicine

## 2012-01-22 VITALS — BP 130/88 | HR 93 | Temp 97.9°F | Ht 64.0 in | Wt 252.4 lb

## 2012-01-22 DIAGNOSIS — J069 Acute upper respiratory infection, unspecified: Secondary | ICD-10-CM

## 2012-01-22 MED ORDER — PREDNISONE 10 MG PO TABS: ORAL_TABLET | ORAL | Status: DC

## 2012-01-22 MED ORDER — DOXYCYCLINE HYCLATE 100 MG PO TABS: 100.0000 mg | ORAL_TABLET | Freq: Two times a day (BID) | ORAL | Status: DC

## 2012-01-22 NOTE — Progress Notes (Signed)
Subjective:    Patient ID: Tammy Velez, female    DOB: November 01, 1965, 46 y.o.   MRN: [id number]  HPI 46 yo female with known hx of multifactorial cough (post viral, sinus, GERD, cyclical cough).  IOV 09/11/10: This is 2 months fu afer her first visit when multimodal Rx was started including our cyclical cough protocol. STates that voice rest helped cough 50% but relapsed 2 weeks later so she followed with another voice rest that again helped 50% for another week or so but now coughing back at baseline. Wakes up at night due to cough. Dry cough.  Severity is moderate but occasionally severe. She notices talking at work in [employer] makes cough worse. Voice rest clearly helps. IN addition, zegerid has helped partially esp with heartburn and stomach issues. Also notices that if and when she follows gerd diet it can help somewhat but she is largely non compliant with anti gerd diet. Uses qnasal all along but finally ran out last week; does not think this helped.   Koufman Cough Reflux Index Score is 31 and fits in with LPR cough (hoarse voice, clearing throat, post nasal drip, cough worse lyng down, lump in throat, annoying couh and heartburn)   OV 11/13/10: cough score improved to 9.5. Methacholine challenge ordered  OV 11/25/10: Fu after methacholine challenge test: TEST done this month is strongly poisitive for asthma. Brought on all symptoms. No new symptomatology. RSI cough score is 16. She is wondering if moving to [place] made her develop asthma.   REC  -Cough is from sinus drainage, acid reflux and ASTHMA as seen on methacholine challenge test #Sinus drainage  - continue  netti pot as much as poosible for sinus - I know you do not like it #Acid Reflux  - definitely causative reason for cough  - continue zegerid 20mg  capsule daily on empty stomach   -- avoid colas, spices, cheeses, spirits, red meats, beer, chocolates, fried foods etc.,   - sleep with head end of bed elevated  - eat  small frequent meals  - do not go to bed for 3 hours after last meal - follow the diet sheet # Asthma   - START QVAR 2 puff twice daily - take sample, show technique - Use albuterol 2 puff as needed; show tecqnique #Followup -2 months or sooner if neede  03/10/2011 Acute OV  Complains of productive cough with yellow mucus, wheezing, increased SOB, low grade temp x 5 days. Doing well until last few days. OTC not working. Cough is getting worse. Keeping her up at night. No hemoptysis or fever. No chest pain or edema.  No new meds or travel.    REC Zpack take as directed.  Mucinex DM Twice daily As needed  Fluids and rest  Hydromet 1-2 tsp every 4 hr As needed Cough- may make you sleepy.  Please contact office for sooner follow up if symptoms do not improve or worsen or seek emergency care  follow up Dr. Marchelle Gearing in 6-8 weeks and As needed    OV 04/23/2011 Followup chronic cough on basis of methacholine proven asthma, GERD and LPR  She saw NP 6 weeks ago for acute bronchitis symptoms and treated with zpak. No prednisone Rx. Since then cough not better. Coughs all the time. Feels chest is congested and tight and she needs to take a deep breath. GAgs +. Feels onset of pollen season is resulting in dry eyes, sneezing and runny nose. Post nasal drip, clearing of throat,  and scratchy throat +. Denies fever, wheeze, edema. RSI cough score is worse and 33 and c/w  LPR cough (Level 5 - post nasal drip, annoying cough. Level 4 - clearing of throat, sensation of something in trhoat. Level 3 - hoarseness of voice, choking episodes, and heartburn. 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 Needs to talk a lot.   Current outpatient prescriptions:beclomethasone (QVAR) 80 MCG/ACT inhaler, Inhale 1 puff into the lungs as needed., Disp: 1 Inhaler, Rfl: 12;  omeprazole-sodium bicarbonate (ZEGERID) 40-1100 MG per capsule, Take 1 capsule  by mouth daily before breakfast.  , Disp: , Rfl:    REC -Cough is from sinus drainage, acid reflux and ASTHMA  - The sinus and asthma appear to be acting up ; we call this exacerbation  #Sinus drainage  - take generic fluticasone inhaler 2 squirts each nostril daily; take script  - if this is not enough add nightly chlorpheniramine 4-8mg  ; can make you very dry  #Acid Reflux  - continue zegerid 20mg  capsule daily on empty stomach  -- avoid colas, spices, cheeses, spirits, red meats, beer, chocolates, fried foods etc.,  - sleep with head end of bed elevated  - eat small frequent meals  - do not go to bed for 3 hours after last meal  - follow the diet sheet  # Asthma  - Please take Take prednisone 40mg  once daily x 3 days, then 30mg  once daily x 3 days, then 20mg  once daily x 3 days, then prednisone 10mg  once daily x 3 days and stop  - stop qvar  - STart Advair HFA 115/21 2 puff twice daily, - take sample, show technique. Rinse mouth after use  - Use albuterol 2 puff as needed; show tecqnique  #Followup  -4-6 weeks or sooner if needed  - at followup do cough score   OV 01/22/2012   - Not followed up for chronic cough becaues it was almost gone/hardly present. She was not doing flonase or ppi. She has continued advair. She was doing real well till 12/16 Monday when she picked up a low grade fever. Then on 01/20/12 lot of nasal congeestion followed by severe cough, chest tightness and wheeze when she lies down. Cough is severe and is associated with insomnia. Cough made worse by supine. Cough associated with lot of nasal drainage, fatigue, myalgia but no more fever. Cough improved by trials of OTC meds like dayquil, nyquil, sudafed  - RSI cough score is 32  Dr Gretta Cool Reflux Symptom Index (> 13-15 suggestive of LPR cough)  01/22/2012   Hoarseness of problem with voice 4  Clearing  Of Throat 4  Excess throat mucus or feeling of post nasal drip 4  Difficulty swallowing food, liquid  or tablets 2  Cough after eating or lying down 5  Breathing difficulties or choking episodes 3  Troublesome or annoying cough 5  Sensation of something sticking in throat or lump in throat 4  Heartburn, chest pain, indigestion, or stomach acid coming up 1  TOTAL 32     Review of Systems  Constitutional: Negative for fever and unexpected weight change.  HENT: Positive for congestion. Negative for ear pain, nosebleeds, sore throat, rhinorrhea, sneezing, trouble swallowing, dental problem, postnasal drip and sinus pressure.   Eyes: Negative for redness and itching.  Respiratory: Positive for cough. Negative for chest tightness, shortness of breath and wheezing.   Cardiovascular: Negative for palpitations and leg  swelling.  Gastrointestinal: Negative for nausea and vomiting.  Genitourinary: Negative for dysuria.  Musculoskeletal: Negative for joint swelling.  Skin: Negative for rash.  Neurological: Negative for headaches.  Hematological: Does not bruise/bleed easily.  Psychiatric/Behavioral: Negative for dysphoric mood. The patient is not nervous/anxious.        Objective:   Physical Exam GEN: A/Ox3; pleasant , NAD, well nourished .  Looks miserable like having a cold. Coughs periodically +  Body mass index is 43.32 kg/(m^2).  HEENT:  NASAL trubinates swollen. Unable to look at rear of throat.   NECK:  Supple w/ fair ROM; no JVD; normal carotid impulses w/o bruits; no thyromegaly or nodules palpated; no lymphadenopathy.  RESP  Coarse BS  w/o, wheezes/ rales/ or rhonchi.no accessory muscle use, no dullness to percussion  CARD:  RRR, no m/r/g  , no peripheral edema, pulses intact, no cyanosis or clubbing.  GI:   Soft & nt; nml bowel sounds; no organomegaly or masses detected.  Musco: Warm bil, no deformities or joint swelling noted.   Neuro: alert, no focal deficits noted.    Skin: Warm, no lesions or rashes          Assessment & Plan:

## 2012-01-22 NOTE — Patient Instructions (Addendum)
Your cough is worse because of acute sinusitis/rhinits and maybe bronchitis This could be causing asthma flare as well Unclear if this is bacterial Recommend  - Take doxycycline 100mg  po twice daily x 6 days; take after meals and avoid sunlight; if expensive call us  .Marland Kitchen Take prednisone 40 mg daily x 2 days, then 20mg  daily x 2 days, then 10mg  daily x 2 days, then 5mg  daily x 2 days and stop  - daily netti pot    - daily flonase 2 squirts each nostril daily  - OTC sudafed and chlortrimeton 4-8mg  as needed   - bed rest  - drink lot of fluids   - continue advair as previously scheduled   REturn 9 months or sooner if needed

## 2012-01-22 NOTE — Assessment & Plan Note (Signed)
Your cough is worse because of acute sinusitis/rhinits and maybe bronchitis This could be causing asthma flare as well Unclear if this is bacterial Recommend  - Take doxycycline 100mg po twice daily x 6 days; take after meals and avoid sunlight; if expensive call us  .. Take prednisone 40 mg daily x 2 days, then 20mg daily x 2 days, then 10mg daily x 2 days, then 5mg daily x 2 days and stop  - daily netti pot    - daily flonase 2 squirts each nostril daily  - OTC sudafed and chlortrimeton 4-8mg as needed   - bed rest  - drink lot of fluids   - continue advair as previously scheduled   REturn 9 months or sooner if needed 

## 2012-09-10 ENCOUNTER — Telehealth: Payer: Self-pay | Admitting: Internal Medicine

## 2012-09-10 NOTE — Telephone Encounter (Signed)
pt lost her job. Can not make her apt at this time. Will call us when ready to schedule again.

## 2013-07-13 ENCOUNTER — Encounter (HOSPITAL_COMMUNITY): Payer: Self-pay | Admitting: *Deleted

## 2013-07-14 ENCOUNTER — Other Ambulatory Visit: Payer: Self-pay | Admitting: Obstetrics and Gynecology

## 2013-09-08 NOTE — H&P (Signed)
NAME:  Tammy, Velez NO.:  [id number]  MEDICAL RECORD NO.:  [id number]  LOCATION:  [facility]                         FACILITY:  [facility]  PHYSICIAN:  Lovenia Kim, M.D.DATE OF BIRTH:  August 13, 1965  DATE OF ADMISSION:  07/12/2013 DATE OF DISCHARGE:                             HISTORY & PHYSICAL   CHIEF COMPLAINT:  Refractory menorrhagia.  HISTORY OF PRESENT ILLNESS:  This is a 48 year old white female with G2, P2, who presents with refractory menorrhagia for hysteroscopy and endometrial ablation.  ALLERGIES:  She has no known drug allergies.  PAST MEDICAL HISTORY:  Obesity and asthma.  MEDICATIONS:  Brisdelle and Lipitor.  SOCIAL HISTORY:  She is a nonsmoker, nondrinker.  She denies domestic or physical violence.  FAMILY HISTORY:  Pancreatic cancer, hypertension, heart disease.  OBSTETRIC HISTORY:  Remarkable for C-section x2.  PHYSICAL EXAMINATION:  GENERAL:  She is a well-developed, well- nourished, white female, in no acute distress. HEENT:  Normal. NECK:  Supple.  Full range of motion. LUNGS:  Clear. HEART:  Regular rate and rhythm. ABDOMEN:  Soft, obese, and nontender. PELVIC:  Reveals uterus to be anteflexed.  No adnexal masses. EXTREMITIES:  There are no cords. NEUROLOGIC:  Nonfocal. SKIN:  Intact.  IMPRESSION:  Refractory menorrhagia for definitive therapy.  PLAN:  Proceed with diagnostic hysteroscopy, D and C, NovaSure endometrial ablation.  Risks of anesthesia, infection, bleeding, injury to surrounding organs, possible need for repair was discussed, delayed versus immediate complications to include bowel and bladder injury noted.  The patient acknowledges and wishes to proceed.     Lovenia Kim, M.D.     RJT/MEDQ  D:  09/08/2013  T:  09/08/2013  Job:  [id number]

## 2013-09-09 ENCOUNTER — Ambulatory Visit (HOSPITAL_COMMUNITY): Payer: Managed Care, Other (non HMO) | Admitting: Certified Registered"

## 2013-09-09 ENCOUNTER — Encounter (HOSPITAL_COMMUNITY)
Admission: RE | Disposition: A | Discharge status: Home / Self Care | Payer: Self-pay | Source: Ambulatory Visit | Attending: Obstetrics and Gynecology

## 2013-09-09 ENCOUNTER — Encounter (HOSPITAL_COMMUNITY): Payer: Self-pay | Admitting: Certified Registered"

## 2013-09-09 ENCOUNTER — Ambulatory Visit (HOSPITAL_COMMUNITY)
Admission: RE | Admit: 2013-09-09 | Discharge: 2013-09-09 | Disposition: A | Discharge status: Home / Self Care | Payer: Managed Care, Other (non HMO) | Source: Ambulatory Visit | Attending: Obstetrics and Gynecology | Admitting: Obstetrics and Gynecology

## 2013-09-09 ENCOUNTER — Encounter (HOSPITAL_COMMUNITY): Payer: Managed Care, Other (non HMO) | Admitting: Certified Registered"

## 2013-09-09 DIAGNOSIS — N920 Excessive and frequent menstruation with regular cycle: Secondary | ICD-10-CM

## 2013-09-09 DIAGNOSIS — Z8249 Family history of ischemic heart disease and other diseases of the circulatory system: Secondary | ICD-10-CM | POA: Insufficient documentation

## 2013-09-09 DIAGNOSIS — E669 Obesity, unspecified: Secondary | ICD-10-CM | POA: Insufficient documentation

## 2013-09-09 DIAGNOSIS — E119 Type 2 diabetes mellitus without complications: Secondary | ICD-10-CM | POA: Insufficient documentation

## 2013-09-09 DIAGNOSIS — J45909 Unspecified asthma, uncomplicated: Secondary | ICD-10-CM | POA: Insufficient documentation

## 2013-09-09 DIAGNOSIS — N840 Polyp of corpus uteri: Secondary | ICD-10-CM | POA: Insufficient documentation

## 2013-09-09 DIAGNOSIS — Z800 Family history of malignant neoplasm of digestive organs: Secondary | ICD-10-CM | POA: Insufficient documentation

## 2013-09-09 HISTORY — PX: DILITATION & CURRETTAGE/HYSTROSCOPY WITH NOVASURE ABLATION: SHX5568

## 2013-09-09 LAB — CBC
HEMATOCRIT: 38.7 % (ref 36.0–46.0)
Hemoglobin: 12.9 g/dL (ref 12.0–15.0)
MCH: 31.2 pg (ref 26.0–34.0)
MCHC: 33.3 g/dL (ref 30.0–36.0)
MCV: 93.7 fL (ref 78.0–100.0)
Platelets: 328 10*3/uL (ref 150–400)
RBC: 4.13 MIL/uL (ref 3.87–5.11)
RDW: 13.2 % (ref 11.5–15.5)
WBC: 8.6 10*3/uL (ref 4.0–10.5)

## 2013-09-09 LAB — BASIC METABOLIC PANEL
Anion gap: 16 — ABNORMAL HIGH (ref 5–15)
BUN: 14 mg/dL (ref 6–23)
CO2: 22 mEq/L (ref 19–32)
Calcium: 9.1 mg/dL (ref 8.4–10.5)
Chloride: 103 mEq/L (ref 96–112)
Creatinine, Ser: 0.89 mg/dL (ref 0.50–1.10)
GFR calc Af Amer: 89 mL/min — ABNORMAL LOW (ref >90)
GFR, EST NON AFRICAN AMERICAN: 77 mL/min — AB (ref >90)
Glucose, Bld: 114 mg/dL — ABNORMAL HIGH (ref 70–99)
POTASSIUM: 4.3 meq/L (ref 3.7–5.3)
Sodium: 141 mEq/L (ref 137–147)

## 2013-09-09 LAB — HCG, SERUM, QUALITATIVE: Preg, Serum: NEGATIVE

## 2013-09-09 LAB — GLUCOSE, CAPILLARY: GLUCOSE-CAPILLARY: 111 mg/dL — AB (ref 70–99)

## 2013-09-09 SURGERY — DILATATION & CURETTAGE/HYSTEROSCOPY WITH NOVASURE ABLATION
Anesthesia: General | Site: Vagina

## 2013-09-09 MED ORDER — PROPOFOL 10 MG/ML IV EMUL
INTRAVENOUS | Status: AC
  Filled 2013-09-09: qty 20

## 2013-09-09 MED ORDER — FENTANYL CITRATE 0.05 MG/ML IJ SOLN
25.0000 ug | INTRAMUSCULAR | Status: DC | PRN
  Administered 2013-09-09: 50 ug via INTRAVENOUS

## 2013-09-09 MED ORDER — CEFAZOLIN SODIUM-DEXTROSE 2-3 GM-% IV SOLR
2.0000 g | INTRAVENOUS | Status: AC
  Administered 2013-09-09: 2 g via INTRAVENOUS

## 2013-09-09 MED ORDER — FENTANYL CITRATE 0.05 MG/ML IJ SOLN
INTRAMUSCULAR | Status: AC
  Administered 2013-09-09: 50 ug via INTRAVENOUS
  Filled 2013-09-09: qty 2

## 2013-09-09 MED ORDER — BUPIVACAINE HCL (PF) 0.25 % IJ SOLN
INTRAMUSCULAR | Status: DC | PRN
  Administered 2013-09-09: 20 mL

## 2013-09-09 MED ORDER — OXYCODONE-ACETAMINOPHEN 5-325 MG PO TABS: 1.0000 | ORAL_TABLET | ORAL | Status: DC | PRN

## 2013-09-09 MED ORDER — DEXAMETHASONE SODIUM PHOSPHATE 10 MG/ML IJ SOLN
INTRAMUSCULAR | Status: DC | PRN
  Administered 2013-09-09: 10 mg via INTRAVENOUS

## 2013-09-09 MED ORDER — PROPOFOL 10 MG/ML IV BOLUS
INTRAVENOUS | Status: DC | PRN
  Administered 2013-09-09: 200 mg via INTRAVENOUS

## 2013-09-09 MED ORDER — LACTATED RINGERS IV SOLN
INTRAVENOUS | Status: DC | PRN
  Administered 2013-09-09: 10:00:00 via INTRAVENOUS

## 2013-09-09 MED ORDER — DEXAMETHASONE SODIUM PHOSPHATE 10 MG/ML IJ SOLN
INTRAMUSCULAR | Status: AC
  Filled 2013-09-09: qty 1

## 2013-09-09 MED ORDER — ONDANSETRON HCL 4 MG/2ML IJ SOLN
INTRAMUSCULAR | Status: AC
  Filled 2013-09-09: qty 2

## 2013-09-09 MED ORDER — ONDANSETRON HCL 4 MG/2ML IJ SOLN: 4.0000 mg | Freq: Once | INTRAMUSCULAR | Status: DC | PRN

## 2013-09-09 MED ORDER — SODIUM CHLORIDE 0.9 % IJ SOLN
INTRAMUSCULAR | Status: AC
  Filled 2013-09-09: qty 50

## 2013-09-09 MED ORDER — LIDOCAINE HCL (CARDIAC) 20 MG/ML IV SOLN
INTRAVENOUS | Status: DC | PRN
  Administered 2013-09-09: 80 mg via INTRAVENOUS

## 2013-09-09 MED ORDER — SCOPOLAMINE 1 MG/3DAYS TD PT72
1.0000 | MEDICATED_PATCH | Freq: Once | TRANSDERMAL | Status: DC
  Administered 2013-09-09: 1.5 mg via TRANSDERMAL

## 2013-09-09 MED ORDER — BUPIVACAINE HCL (PF) 0.25 % IJ SOLN
INTRAMUSCULAR | Status: AC
  Filled 2013-09-09: qty 10

## 2013-09-09 MED ORDER — CEFAZOLIN SODIUM-DEXTROSE 2-3 GM-% IV SOLR
INTRAVENOUS | Status: AC
  Filled 2013-09-09: qty 50

## 2013-09-09 MED ORDER — KETOROLAC TROMETHAMINE 30 MG/ML IJ SOLN
INTRAMUSCULAR | Status: DC | PRN
  Administered 2013-09-09: 30 mg via INTRAVENOUS

## 2013-09-09 MED ORDER — MIDAZOLAM HCL 2 MG/2ML IJ SOLN
INTRAMUSCULAR | Status: DC | PRN
  Administered 2013-09-09: 2 mg via INTRAVENOUS

## 2013-09-09 MED ORDER — FENTANYL CITRATE 0.05 MG/ML IJ SOLN
INTRAMUSCULAR | Status: DC | PRN
  Administered 2013-09-09 (×2): 50 ug via INTRAVENOUS

## 2013-09-09 MED ORDER — LIDOCAINE HCL (CARDIAC) 20 MG/ML IV SOLN
INTRAVENOUS | Status: AC
  Filled 2013-09-09: qty 5

## 2013-09-09 MED ORDER — HYDROMORPHONE HCL PF 1 MG/ML IJ SOLN
INTRAMUSCULAR | Status: DC | PRN
  Administered 2013-09-09: 1 mg via INTRAVENOUS

## 2013-09-09 MED ORDER — MEPERIDINE HCL 25 MG/ML IJ SOLN: 6.2500 mg | INTRAMUSCULAR | Status: DC | PRN

## 2013-09-09 MED ORDER — BUPIVACAINE HCL (PF) 0.25 % IJ SOLN
INTRAMUSCULAR | Status: AC
  Filled 2013-09-09: qty 30

## 2013-09-09 MED ORDER — VASOPRESSIN 20 UNIT/ML IJ SOLN
INTRAMUSCULAR | Status: AC
  Filled 2013-09-09: qty 1

## 2013-09-09 MED ORDER — SCOPOLAMINE 1 MG/3DAYS TD PT72
MEDICATED_PATCH | TRANSDERMAL | Status: AC
  Administered 2013-09-09: 1.5 mg via TRANSDERMAL
  Filled 2013-09-09: qty 1

## 2013-09-09 MED ORDER — KETOROLAC TROMETHAMINE 30 MG/ML IJ SOLN
INTRAMUSCULAR | Status: AC
  Filled 2013-09-09: qty 1

## 2013-09-09 MED ORDER — MIDAZOLAM HCL 2 MG/2ML IJ SOLN
INTRAMUSCULAR | Status: AC
  Filled 2013-09-09: qty 2

## 2013-09-09 MED ORDER — FENTANYL CITRATE 0.05 MG/ML IJ SOLN
INTRAMUSCULAR | Status: AC
  Filled 2013-09-09: qty 2

## 2013-09-09 MED ORDER — ONDANSETRON HCL 4 MG/2ML IJ SOLN
INTRAMUSCULAR | Status: DC | PRN
  Administered 2013-09-09: 4 mg via INTRAVENOUS

## 2013-09-09 MED ORDER — HYDROMORPHONE HCL PF 1 MG/ML IJ SOLN
INTRAMUSCULAR | Status: AC
  Filled 2013-09-09: qty 1

## 2013-09-09 MED ORDER — LACTATED RINGERS IR SOLN
Status: DC | PRN
  Administered 2013-09-09: 3000 mL

## 2013-09-09 MED ORDER — KETOROLAC TROMETHAMINE 30 MG/ML IJ SOLN: 15.0000 mg | Freq: Once | INTRAMUSCULAR | Status: DC | PRN

## 2013-09-09 MED ORDER — LACTATED RINGERS IV SOLN
INTRAVENOUS | Status: DC
  Administered 2013-09-09 (×2): via INTRAVENOUS

## 2013-09-09 SURGICAL SUPPLY — 18 items
ABLATOR ENDOMETRIAL BIPOLAR (ABLATOR) ×2 IMPLANT
CATH ROBINSON RED A/P 16FR (CATHETERS) ×2 IMPLANT
CLOTH BEACON ORANGE TIMEOUT ST (SAFETY) ×2 IMPLANT
CONTAINER PREFILL 10% NBF 60ML (FORM) ×4 IMPLANT
DECANTER SPIKE VIAL GLASS SM (MISCELLANEOUS) ×2 IMPLANT
DRAPE HYSTEROSCOPY (DRAPE) ×2 IMPLANT
GLOVE BIO SURGEON STRL SZ7.5 (GLOVE) ×4 IMPLANT
GLOVE BIOGEL PI IND STRL 6.5 (GLOVE) ×1 IMPLANT
GLOVE BIOGEL PI INDICATOR 6.5 (GLOVE) ×1
GOWN STRL REUS W/TWL LRG LVL3 (GOWN DISPOSABLE) ×4 IMPLANT
PACK VAGINAL MINOR WOMEN LF (CUSTOM PROCEDURE TRAY) ×2 IMPLANT
PAD OB MATERNITY 4.3X12.25 (PERSONAL CARE ITEMS) ×2 IMPLANT
PAD PREP 24X48 CUFFED NSTRL (MISCELLANEOUS) ×2 IMPLANT
SET TUBING HYSTEROSCOPY 2 NDL (TUBING) IMPLANT
SYR TB 1ML 25GX5/8 (SYRINGE) ×2 IMPLANT
TOWEL OR 17X24 6PK STRL BLUE (TOWEL DISPOSABLE) ×4 IMPLANT
TUBE HYSTEROSCOPY W Y-CONNECT (TUBING) IMPLANT
WATER STERILE IRR 1000ML POUR (IV SOLUTION) ×2 IMPLANT

## 2013-09-09 NOTE — Anesthesia Preprocedure Evaluation (Signed)
Anesthesia Evaluation  Patient identified by MRN, date of birth, ID band  Reviewed: Allergy & Precautions, H&P , Patient's Chart, lab work & pertinent test results  Airway Mallampati: I TM Distance: >3 FB Neck ROM: full    Dental no notable dental hx. (+) Teeth Intact   Pulmonary    Pulmonary exam normal       Cardiovascular negative cardio ROS  Rate:Normal     Neuro/Psych negative neurological ROS  negative psych ROS   GI/Hepatic negative GI ROS, Neg liver ROS,   Endo/Other  diabetes, Type 2  Renal/GU      Musculoskeletal   Abdominal Normal abdominal exam  (+)   Peds  Hematology negative hematology ROS (+)   Anesthesia Other Findings   Reproductive/Obstetrics negative OB ROS                           Anesthesia Physical Anesthesia Plan  ASA: II  Anesthesia Plan: General   Post-op Pain Management:    Induction: Intravenous  Airway Management Planned: LMA  Additional Equipment:   Intra-op Plan:   Post-operative Plan:   Informed Consent: I have reviewed the patients History and Physical, chart, labs and discussed the procedure including the risks, benefits and alternatives for the proposed anesthesia with the patient or authorized representative who has indicated his/her understanding and acceptance.     Plan Discussed with: CRNA and Surgeon  Anesthesia Plan Comments:         Anesthesia Quick Evaluation

## 2013-09-09 NOTE — Op Note (Signed)
NAME:  NATINA, WIGINTON NO.:  [id number]  MEDICAL RECORD NO.:  [id number]  LOCATION:  [facility]                          FACILITY:  [facility]  PHYSICIAN:  Lovenia Kim, M.D.DATE OF BIRTH:  [birth year]  DATE OF PROCEDURE: DATE OF DISCHARGE:  09/09/2013                              OPERATIVE REPORT   PREOPERATIVE DIAGNOSIS:  Menorrhagia.  POSTOPERATIVE DIAGNOSES:  Menorrhagia.  Endometrial polyp.  PROCEDURE:  Diagnostic hysteroscopy, D and C, endometrial polypectomy, NovaSure endometrial ablation.  SURGEON:  Lovenia Kim, M.D.  ASSISTANT:  None.  ANESTHESIA:  General and local.  ESTIMATED BLOOD LOSS:  Less than 50 mL.  FLUID DEFICIT:  70 mL.  COMPLICATIONS:  None.  DRAINS:  None.  COUNTS:  Correct.  DISPOSITION:  The patient to recovery in good condition.  BRIEF OPERATIVE NOTE:  After being apprised of the risks of anesthesia, infection, bleeding, injury to surrounding organs, and possible need for repair, delayed versus immediate complications to include bowel and bladder injury, possible need for repair, the patient was brought to the operating room where she was administered a general anesthetic without complications.  Prepped and draped in usual sterile fashion, Feet were placed in [place].  Exam under anesthesia revealed a bulky anteflexed uterus and no adnexal masses.  At this time, speculum was placed, paracervical block using 20 mL of dilute Marcaine solution was placed.  After placement of adequate anesthetic, cervix was dilated easily up to a 23 Pratt dilator.  Hysteroscope placed.  Visualization revealed anterior wall endometrial polyp, but an otherwise normal cavity with bilateral normal tubal ostia.  The endometrial polyp was removed in conjunction with endometrial curettings using sharp curettage and polyp forceps and sent as one specimen.  At this time, the NovaSure device was then placed, seated to a length of 6.5, a width of  4.5.  CO2 test was negative.  Procedure was initiated for approximately 67 seconds and the procedure was terminated.  At this point, instruments were removed, inspected and found to be intact.  Revisualization of the endometrial cavity revealed an intact endometrial cavity with no evidence of uterine perforation and a well-ablated endometrial cavity.  Fluid deficit and blood loss were as noted.  The patient tolerated the procedure well, was awakened and transferred to the recovery in good condition.     Lovenia Kim, M.D.     RJT/MEDQ  D:  09/09/2013  T:  09/09/2013  Job:  [id number]

## 2013-09-09 NOTE — Discharge Instructions (Signed)

## 2013-09-09 NOTE — Op Note (Signed)
09/09/2013  10:28 AM  PATIENT:  Johnnette Barrios  48 y.o. female  PRE-OPERATIVE DIAGNOSIS:  Menorrhagia  POST-OPERATIVE DIAGNOSIS:  Menorrhagia Endometrial polyp  PROCEDURE:  Procedure(s): DILATATION & CURETTAGE/HYSTEROSCOPY WITH NOVASURE ABLATION  SURGEON:  Surgeon(s): Lovenia Kim, MD  ASSISTANTS: none   ANESTHESIA:   local and general  ESTIMATED BLOOD LOSS: minimal FLUID DEFICIT: 70cc  DRAINS: none   LOCAL MEDICATIONS USED:  MARCAINE    and Amount: 20 ml  SPECIMEN:  Source of Specimen:  EMC and polyp  DISPOSITION OF SPECIMEN:  PATHOLOGY  COUNTS:  YES  DICTATION #: [id number] PLAN OF CARE: DC home  PATIENT DISPOSITION:  PACU - hemodynamically stable.

## 2013-09-09 NOTE — Progress Notes (Signed)
Patient ID: Tammy Velez, female   DOB: 1965-10-31, 48 y.o.   MRN: [id number] Patient seen and examined. Consent witnessed and signed. No changes noted. Update completed.

## 2013-09-09 NOTE — Transfer of Care (Signed)
Immediate Anesthesia Transfer of Care Note  Patient: Tammy Velez  Procedure(s) Performed: Procedure(s): DILATATION & CURETTAGE/HYSTEROSCOPY WITH NOVASURE ABLATION (N/A)  Patient Location: PACU  Anesthesia Type:General  Level of Consciousness: awake, alert  and oriented  Airway & Oxygen Therapy: Patient Spontanous Breathing and Patient connected to nasal cannula oxygen  Post-op Assessment: Report given to PACU RN, Post -op Vital signs reviewed and stable and Patient moving all extremities  Post vital signs: Reviewed and stable  Complications: No apparent anesthesia complications

## 2013-09-12 ENCOUNTER — Encounter (HOSPITAL_COMMUNITY): Payer: Self-pay | Admitting: Obstetrics and Gynecology

## 2013-09-12 NOTE — Anesthesia Postprocedure Evaluation (Signed)
Anesthesia Post Note  Patient: Tammy Velez  Procedure(s) Performed: Procedure(s) (LRB): DILATATION & CURETTAGE/HYSTEROSCOPY WITH NOVASURE ABLATION (N/A)  Anesthesia type: General  Patient location: PACU  Post pain: Pain level controlled  Post assessment: Post-op Vital signs reviewed  Last Vitals:  Filed Vitals:   09/09/13 1130  BP: 120/69  Pulse: 78  Temp: 36.4 C  Resp: 18    Post vital signs: Reviewed  Level of consciousness: sedated  Complications: No apparent anesthesia complications

## 2014-01-25 ENCOUNTER — Other Ambulatory Visit (HOSPITAL_COMMUNITY): Payer: Self-pay | Admitting: Family Medicine

## 2014-01-25 DIAGNOSIS — R1011 Right upper quadrant pain: Secondary | ICD-10-CM

## 2014-01-25 DIAGNOSIS — R110 Nausea: Secondary | ICD-10-CM

## 2014-01-26 ENCOUNTER — Ambulatory Visit (HOSPITAL_COMMUNITY)
Admission: RE | Admit: 2014-01-26 | Discharge: 2014-01-26 | Disposition: A | Discharge status: Home / Self Care | Payer: Commercial Indemnity | Source: Ambulatory Visit | Attending: Family Medicine | Admitting: Family Medicine

## 2014-01-26 DIAGNOSIS — K8020 Calculus of gallbladder without cholecystitis without obstruction: Secondary | ICD-10-CM | POA: Insufficient documentation

## 2014-01-26 DIAGNOSIS — R1011 Right upper quadrant pain: Secondary | ICD-10-CM

## 2014-01-26 DIAGNOSIS — R110 Nausea: Secondary | ICD-10-CM

## 2014-02-02 HISTORY — PX: BACK SURGERY: SHX140

## 2014-09-20 ENCOUNTER — Other Ambulatory Visit: Payer: Self-pay | Admitting: General Surgery

## 2014-10-03 ENCOUNTER — Encounter (HOSPITAL_COMMUNITY): Payer: Self-pay

## 2014-10-03 ENCOUNTER — Encounter (HOSPITAL_COMMUNITY)
Admission: RE | Admit: 2014-10-03 | Discharge: 2014-10-03 | Disposition: A | Discharge status: Home / Self Care | Payer: Managed Care, Other (non HMO) | Source: Ambulatory Visit | Attending: General Surgery | Admitting: General Surgery

## 2014-10-03 DIAGNOSIS — Z6841 Body Mass Index (BMI) 40.0 and over, adult: Secondary | ICD-10-CM | POA: Diagnosis not present

## 2014-10-03 DIAGNOSIS — K8020 Calculus of gallbladder without cholecystitis without obstruction: Secondary | ICD-10-CM | POA: Diagnosis present

## 2014-10-03 HISTORY — DX: Constipation, unspecified: K59.00

## 2014-10-03 HISTORY — DX: Headache, unspecified: R51.9

## 2014-10-03 HISTORY — DX: Adverse effect of unspecified anesthetic, initial encounter: T41.45XA

## 2014-10-03 HISTORY — DX: Headache: R51

## 2014-10-03 LAB — COMPREHENSIVE METABOLIC PANEL
ALBUMIN: 3.6 g/dL (ref 3.5–5.0)
ALK PHOS: 52 U/L (ref 38–126)
ALT: 13 U/L — AB (ref 14–54)
AST: 15 U/L (ref 15–41)
Anion gap: 8 (ref 5–15)
BUN: 15 mg/dL (ref 6–20)
CALCIUM: 8.9 mg/dL (ref 8.9–10.3)
CHLORIDE: 106 mmol/L (ref 101–111)
CO2: 23 mmol/L (ref 22–32)
CREATININE: 0.9 mg/dL (ref 0.44–1.00)
GFR calc Af Amer: >60 mL/min (ref >60)
GFR calc non Af Amer: >60 mL/min (ref >60)
GLUCOSE: 107 mg/dL — AB (ref 65–99)
Potassium: 4.1 mmol/L (ref 3.5–5.1)
SODIUM: 137 mmol/L (ref 135–145)
Total Bilirubin: 0.6 mg/dL (ref 0.3–1.2)
Total Protein: 7 g/dL (ref 6.5–8.1)

## 2014-10-03 LAB — CBC WITH DIFFERENTIAL/PLATELET
BASOS ABS: 0 10*3/uL (ref 0.0–0.1)
BASOS PCT: 0 % (ref 0–1)
EOS ABS: 0.2 10*3/uL (ref 0.0–0.7)
EOS PCT: 2 % (ref 0–5)
HCT: 42.2 % (ref 36.0–46.0)
HEMOGLOBIN: 13.7 g/dL (ref 12.0–15.0)
LYMPHS ABS: 2.7 10*3/uL (ref 0.7–4.0)
Lymphocytes Relative: 31 % (ref 12–46)
MCH: 30.7 pg (ref 26.0–34.0)
MCHC: 32.5 g/dL (ref 30.0–36.0)
MCV: 94.6 fL (ref 78.0–100.0)
Monocytes Absolute: 0.7 10*3/uL (ref 0.1–1.0)
Monocytes Relative: 8 % (ref 3–12)
NEUTROS PCT: 59 % (ref 43–77)
Neutro Abs: 5 10*3/uL (ref 1.7–7.7)
PLATELETS: 349 10*3/uL (ref 150–400)
RBC: 4.46 MIL/uL (ref 3.87–5.11)
RDW: 12.8 % (ref 11.5–15.5)
WBC: 8.6 10*3/uL (ref 4.0–10.5)

## 2014-10-03 LAB — HCG, SERUM, QUALITATIVE: Preg, Serum: NEGATIVE

## 2014-10-03 NOTE — Pre-Procedure Instructions (Addendum)
    EEVA SCHLOSSER  10/03/2014      Your procedure is scheduled on Friday, September 2.  Report to [facility] Admitting at 7:00 A.M.                 Your surgery is scheduled for 9:00 AM   Call this number if you have problems the morning of surgery:[phone]                For any other questions, please call [phone], Monday - Friday 8 AM - 4 PM.   Remember:  Do not eat food or drink liquids after midnight Thursday, September 1.  Take these medicines the morning of surgery with A SIP OF WATER  May use Advair.  If needed and you are able to take on an empty stomach.               Do not take any Aspirin or Aspirin products, Advil (Ibuprofen), Aleve (Naproxen).   Do not wear jewelry, make-up or nail polish.  Do not wear lotions, powders, or perfumes.    Do not shave 48 hours prior to surgery.    Do not bring valuables to the hospital.  [facility] is not responsible for any belongings or valuables.  Contacts, dentures or bridgework may not be worn into surgery.  Leave your suitcase in the car.  After surgery it may be brought to your room.  For patients admitted to the hospital, discharge time will be determined by your treatment team.  Patients discharged the day of surgery will not be allowed to drive home.   Name and phone number of your driver:   -  Special instructions:  Review  [place] - Preparing For Surgery.  Please read over the following fact sheets that you were given. Pain Booklet, Coughing and Deep Breathing and Surgical Site Infection Prevention

## 2014-10-05 MED ORDER — DEXTROSE 5 % IV SOLN
3.0000 g | INTRAVENOUS | Status: AC
  Administered 2014-10-06: 3 g via INTRAVENOUS
  Filled 2014-10-05: qty 3000

## 2014-10-06 ENCOUNTER — Encounter (HOSPITAL_COMMUNITY): Payer: Self-pay | Admitting: Certified Registered Nurse Anesthetist

## 2014-10-06 ENCOUNTER — Ambulatory Visit (HOSPITAL_COMMUNITY): Payer: Managed Care, Other (non HMO)

## 2014-10-06 ENCOUNTER — Ambulatory Visit (HOSPITAL_COMMUNITY): Payer: Managed Care, Other (non HMO) | Admitting: Certified Registered Nurse Anesthetist

## 2014-10-06 ENCOUNTER — Encounter (HOSPITAL_COMMUNITY)
Admission: RE | Disposition: A | Discharge status: Home / Self Care | Payer: Self-pay | Source: Ambulatory Visit | Attending: General Surgery

## 2014-10-06 ENCOUNTER — Ambulatory Visit (HOSPITAL_COMMUNITY)
Admission: RE | Admit: 2014-10-06 | Discharge: 2014-10-06 | Disposition: A | Discharge status: Home / Self Care | Payer: Managed Care, Other (non HMO) | Source: Ambulatory Visit | Attending: General Surgery | Admitting: General Surgery

## 2014-10-06 DIAGNOSIS — Z6841 Body Mass Index (BMI) 40.0 and over, adult: Secondary | ICD-10-CM | POA: Insufficient documentation

## 2014-10-06 DIAGNOSIS — K8020 Calculus of gallbladder without cholecystitis without obstruction: Secondary | ICD-10-CM | POA: Diagnosis not present

## 2014-10-06 HISTORY — PX: CHOLECYSTECTOMY: SHX55

## 2014-10-06 SURGERY — LAPAROSCOPIC CHOLECYSTECTOMY WITH INTRAOPERATIVE CHOLANGIOGRAM
Anesthesia: General | Site: Abdomen

## 2014-10-06 MED ORDER — SODIUM CHLORIDE 0.9 % IV SOLN
INTRAVENOUS | Status: DC | PRN
  Administered 2014-10-06: 24 mL

## 2014-10-06 MED ORDER — MIDAZOLAM HCL 2 MG/2ML IJ SOLN
INTRAMUSCULAR | Status: AC
  Filled 2014-10-06: qty 4

## 2014-10-06 MED ORDER — NEOSTIGMINE METHYLSULFATE 10 MG/10ML IV SOLN
INTRAVENOUS | Status: AC
  Filled 2014-10-06: qty 1

## 2014-10-06 MED ORDER — FENTANYL CITRATE (PF) 250 MCG/5ML IJ SOLN
INTRAMUSCULAR | Status: AC
  Filled 2014-10-06: qty 5

## 2014-10-06 MED ORDER — EPHEDRINE SULFATE 50 MG/ML IJ SOLN
INTRAMUSCULAR | Status: AC
  Filled 2014-10-06: qty 1

## 2014-10-06 MED ORDER — SODIUM CHLORIDE 0.9 % IR SOLN
Status: DC | PRN
  Administered 2014-10-06: 1

## 2014-10-06 MED ORDER — ACETAMINOPHEN 160 MG/5ML PO SOLN
325.0000 mg | ORAL | Status: DC | PRN
  Filled 2014-10-06: qty 20.3

## 2014-10-06 MED ORDER — LACTATED RINGERS IV SOLN
INTRAVENOUS | Status: DC
  Administered 2014-10-06: 07:00:00 via INTRAVENOUS

## 2014-10-06 MED ORDER — MIDAZOLAM HCL 5 MG/5ML IJ SOLN
INTRAMUSCULAR | Status: DC | PRN
  Administered 2014-10-06: 2 mg via INTRAVENOUS

## 2014-10-06 MED ORDER — LIDOCAINE HCL (CARDIAC) 20 MG/ML IV SOLN
INTRAVENOUS | Status: AC
  Filled 2014-10-06: qty 5

## 2014-10-06 MED ORDER — FENTANYL CITRATE (PF) 100 MCG/2ML IJ SOLN
INTRAMUSCULAR | Status: AC
  Administered 2014-10-06: 50 ug via INTRAVENOUS
  Filled 2014-10-06: qty 2

## 2014-10-06 MED ORDER — FENTANYL CITRATE (PF) 100 MCG/2ML IJ SOLN
25.0000 ug | INTRAMUSCULAR | Status: DC | PRN
  Administered 2014-10-06 (×3): 50 ug via INTRAVENOUS

## 2014-10-06 MED ORDER — FENTANYL CITRATE (PF) 100 MCG/2ML IJ SOLN
INTRAMUSCULAR | Status: AC
  Filled 2014-10-06: qty 2

## 2014-10-06 MED ORDER — LACTATED RINGERS IV SOLN
INTRAVENOUS | Status: DC | PRN
  Administered 2014-10-06 (×2): via INTRAVENOUS

## 2014-10-06 MED ORDER — NEOSTIGMINE METHYLSULFATE 10 MG/10ML IV SOLN
INTRAVENOUS | Status: DC | PRN
  Administered 2014-10-06: 4 mg via INTRAVENOUS

## 2014-10-06 MED ORDER — ONDANSETRON HCL 4 MG/2ML IJ SOLN
INTRAMUSCULAR | Status: AC
  Filled 2014-10-06: qty 2

## 2014-10-06 MED ORDER — OXYCODONE HCL 5 MG/5ML PO SOLN: 5.0000 mg | Freq: Once | ORAL | Status: AC | PRN

## 2014-10-06 MED ORDER — ROCURONIUM BROMIDE 50 MG/5ML IV SOLN
INTRAVENOUS | Status: AC
  Filled 2014-10-06: qty 1

## 2014-10-06 MED ORDER — SUCCINYLCHOLINE CHLORIDE 20 MG/ML IJ SOLN
INTRAMUSCULAR | Status: DC | PRN
  Administered 2014-10-06: 60 mg via INTRAVENOUS

## 2014-10-06 MED ORDER — LIDOCAINE HCL (CARDIAC) 20 MG/ML IV SOLN
INTRAVENOUS | Status: DC | PRN
  Administered 2014-10-06: 60 mg via INTRAVENOUS

## 2014-10-06 MED ORDER — ACETAMINOPHEN 325 MG PO TABS: 325.0000 mg | ORAL_TABLET | ORAL | Status: DC | PRN

## 2014-10-06 MED ORDER — PROPOFOL 10 MG/ML IV BOLUS
INTRAVENOUS | Status: AC
  Filled 2014-10-06: qty 20

## 2014-10-06 MED ORDER — 0.9 % SODIUM CHLORIDE (POUR BTL) OPTIME
TOPICAL | Status: DC | PRN
  Administered 2014-10-06: 1000 mL

## 2014-10-06 MED ORDER — DEXAMETHASONE SODIUM PHOSPHATE 4 MG/ML IJ SOLN
INTRAMUSCULAR | Status: DC | PRN
  Administered 2014-10-06: 8 mg via INTRAVENOUS

## 2014-10-06 MED ORDER — PROPOFOL 10 MG/ML IV BOLUS
INTRAVENOUS | Status: DC | PRN
  Administered 2014-10-06: 30 mg via INTRAVENOUS
  Administered 2014-10-06: 20 mg via INTRAVENOUS
  Administered 2014-10-06: 200 mg via INTRAVENOUS

## 2014-10-06 MED ORDER — OXYCODONE-ACETAMINOPHEN 5-325 MG PO TABS: 1.0000 | ORAL_TABLET | ORAL | Status: DC | PRN

## 2014-10-06 MED ORDER — OXYCODONE HCL 5 MG PO TABS
5.0000 mg | ORAL_TABLET | Freq: Once | ORAL | Status: AC | PRN
  Administered 2014-10-06: 5 mg via ORAL

## 2014-10-06 MED ORDER — SUCCINYLCHOLINE CHLORIDE 20 MG/ML IJ SOLN
INTRAMUSCULAR | Status: AC
  Filled 2014-10-06: qty 1

## 2014-10-06 MED ORDER — ROCURONIUM BROMIDE 100 MG/10ML IV SOLN
INTRAVENOUS | Status: DC | PRN
  Administered 2014-10-06: 5 mg via INTRAVENOUS
  Administered 2014-10-06: 30 mg via INTRAVENOUS

## 2014-10-06 MED ORDER — OXYCODONE HCL 5 MG PO TABS
ORAL_TABLET | ORAL | Status: AC
  Filled 2014-10-06: qty 1

## 2014-10-06 MED ORDER — CHLORHEXIDINE GLUCONATE 4 % EX LIQD: 1.0000 "application " | Freq: Once | CUTANEOUS | Status: DC

## 2014-10-06 MED ORDER — DEXAMETHASONE SODIUM PHOSPHATE 4 MG/ML IJ SOLN
INTRAMUSCULAR | Status: AC
  Filled 2014-10-06: qty 2

## 2014-10-06 MED ORDER — STERILE WATER FOR INJECTION IJ SOLN
INTRAMUSCULAR | Status: AC
  Filled 2014-10-06: qty 10

## 2014-10-06 MED ORDER — ONDANSETRON HCL 4 MG/2ML IJ SOLN
INTRAMUSCULAR | Status: DC | PRN
  Administered 2014-10-06: 4 mg via INTRAVENOUS

## 2014-10-06 MED ORDER — GLYCOPYRROLATE 0.2 MG/ML IJ SOLN
INTRAMUSCULAR | Status: DC | PRN
  Administered 2014-10-06: 0.6 mg via INTRAVENOUS

## 2014-10-06 MED ORDER — FENTANYL CITRATE (PF) 100 MCG/2ML IJ SOLN
INTRAMUSCULAR | Status: DC | PRN
  Administered 2014-10-06 (×2): 50 ug via INTRAVENOUS
  Administered 2014-10-06: 100 ug via INTRAVENOUS
  Administered 2014-10-06 (×4): 50 ug via INTRAVENOUS

## 2014-10-06 MED ORDER — GLYCOPYRROLATE 0.2 MG/ML IJ SOLN
INTRAMUSCULAR | Status: AC
  Filled 2014-10-06: qty 4

## 2014-10-06 MED ORDER — BUPIVACAINE-EPINEPHRINE (PF) 0.25% -1:200000 IJ SOLN
INTRAMUSCULAR | Status: AC
  Filled 2014-10-06: qty 30

## 2014-10-06 MED ORDER — BUPIVACAINE-EPINEPHRINE 0.25% -1:200000 IJ SOLN
INTRAMUSCULAR | Status: DC | PRN
  Administered 2014-10-06: 23 mL

## 2014-10-06 SURGICAL SUPPLY — 40 items
APPLIER CLIP 5 13 M/L LIGAMAX5 (MISCELLANEOUS) ×2
APR CLP MED LRG 5 ANG JAW (MISCELLANEOUS) ×1
BAG SPEC RTRVL LRG 6X4 10 (ENDOMECHANICALS) ×1
BLADE SURG ROTATE 9660 (MISCELLANEOUS) IMPLANT
CANISTER SUCTION 2500CC (MISCELLANEOUS) ×2 IMPLANT
CATH REDDICK CHOLANGI 4FR 50CM (CATHETERS) ×2 IMPLANT
CHLORAPREP W/TINT 26ML (MISCELLANEOUS) ×2 IMPLANT
CLIP APPLIE 5 13 M/L LIGAMAX5 (MISCELLANEOUS) ×1 IMPLANT
COVER MAYO STAND STRL (DRAPES) ×2 IMPLANT
COVER SURGICAL LIGHT HANDLE (MISCELLANEOUS) ×2 IMPLANT
DRAPE C-ARM 42X72 X-RAY (DRAPES) ×2 IMPLANT
ELECT REM PT RETURN 9FT ADLT (ELECTROSURGICAL) ×2
ELECTRODE REM PT RTRN 9FT ADLT (ELECTROSURGICAL) ×1 IMPLANT
GLOVE BIO SURGEON STRL SZ7.5 (GLOVE) ×2 IMPLANT
GLOVE BIOGEL PI IND STRL 7.0 (GLOVE) ×2 IMPLANT
GLOVE BIOGEL PI IND STRL 7.5 (GLOVE) ×2 IMPLANT
GLOVE BIOGEL PI INDICATOR 7.0 (GLOVE) ×2
GLOVE BIOGEL PI INDICATOR 7.5 (GLOVE) ×2
GLOVE SURG SS PI 7.0 STRL IVOR (GLOVE) ×2 IMPLANT
GLOVE SURG SS PI 7.5 STRL IVOR (GLOVE) ×2 IMPLANT
GOWN STRL REUS W/ TWL LRG LVL3 (GOWN DISPOSABLE) ×3 IMPLANT
GOWN STRL REUS W/TWL LRG LVL3 (GOWN DISPOSABLE) ×6
IV CATH 14GX2 1/4 (CATHETERS) ×2 IMPLANT
KIT BASIN OR (CUSTOM PROCEDURE TRAY) ×2 IMPLANT
KIT ROOM TURNOVER OR (KITS) ×2 IMPLANT
LIQUID BAND (GAUZE/BANDAGES/DRESSINGS) ×2 IMPLANT
NS IRRIG 1000ML POUR BTL (IV SOLUTION) ×2 IMPLANT
PAD ARMBOARD 7.5X6 YLW CONV (MISCELLANEOUS) ×2 IMPLANT
POUCH SPECIMEN RETRIEVAL 10MM (ENDOMECHANICALS) ×2 IMPLANT
SCISSORS LAP 5X35 DISP (ENDOMECHANICALS) ×2 IMPLANT
SET IRRIG TUBING LAPAROSCOPIC (IRRIGATION / IRRIGATOR) ×2 IMPLANT
SLEEVE ENDOPATH XCEL 5M (ENDOMECHANICALS) ×4 IMPLANT
SPECIMEN JAR SMALL (MISCELLANEOUS) ×2 IMPLANT
SUT MNCRL AB 4-0 PS2 18 (SUTURE) ×2 IMPLANT
TOWEL OR 17X24 6PK STRL BLUE (TOWEL DISPOSABLE) ×2 IMPLANT
TOWEL OR 17X26 10 PK STRL BLUE (TOWEL DISPOSABLE) ×2 IMPLANT
TRAY LAPAROSCOPIC MC (CUSTOM PROCEDURE TRAY) ×2 IMPLANT
TROCAR XCEL BLUNT TIP 100MML (ENDOMECHANICALS) ×2 IMPLANT
TROCAR XCEL NON-BLD 5MMX100MML (ENDOMECHANICALS) ×2 IMPLANT
TUBING INSUFFLATION (TUBING) ×2 IMPLANT

## 2014-10-06 NOTE — Transfer of Care (Signed)
Immediate Anesthesia Transfer of Care Note  Patient: Tammy Velez  Procedure(s) Performed: Procedure(s): LAPAROSCOPIC CHOLECYSTECTOMY WITH INTRAOPERATIVE CHOLANGIOGRAM (N/A)  Patient Location: PACU  Anesthesia Type:General  Level of Consciousness: awake, alert  and oriented  Airway & Oxygen Therapy: Patient Spontanous Breathing and Patient connected to nasal cannula oxygen  Post-op Assessment: Report given to RN, Post -op Vital signs reviewed and stable and Patient moving all extremities X 4  Post vital signs: Reviewed and stable  Last Vitals:  Filed Vitals:   10/06/14 1052  BP:   Pulse:   Temp: 36.4 C  Resp:     Complications: No apparent anesthesia complications

## 2014-10-06 NOTE — Interval H&P Note (Signed)
History and Physical Interval Note:  10/06/2014 9:15 AM  Tammy Velez  has presented today for surgery, with the diagnosis of gallstones  The various methods of treatment have been discussed with the patient and family. After consideration of risks, benefits and other options for treatment, the patient has consented to  Procedure(s): LAPAROSCOPIC CHOLECYSTECTOMY WITH INTRAOPERATIVE CHOLANGIOGRAM (N/A) as a surgical intervention .  The patient's history has been reviewed, patient examined, no change in status, stable for surgery.  I have reviewed the patient's chart and labs.  Questions were answered to the patient's satisfaction.     TOTH III,PAUL S

## 2014-10-06 NOTE — Anesthesia Preprocedure Evaluation (Addendum)
Anesthesia Evaluation  Patient identified by MRN, date of birth, ID band Patient awake    Reviewed: Allergy & Precautions, NPO status , Patient's Chart, lab work & pertinent test results  History of Anesthesia Complications Negative for: history of anesthetic complications  Airway Mallampati: III  TM Distance: >3 FB Neck ROM: Full    Dental  (+) Dental Advisory Given, Teeth Intact   Pulmonary neg shortness of breath, asthma , neg sleep apnea, neg recent URI,  breath sounds clear to auscultation        Cardiovascular negative cardio ROS  Rhythm:Regular     Neuro/Psych  Headaches, negative psych ROS   GI/Hepatic Gall stones   Endo/Other  Morbid obesity  Renal/GU negative Renal ROS     Musculoskeletal   Abdominal   Peds  Hematology negative hematology ROS (+)   Anesthesia Other Findings   Reproductive/Obstetrics                           Anesthesia Physical Anesthesia Plan  ASA: II  Anesthesia Plan: General   Post-op Pain Management:    Induction: Intravenous  Airway Management Planned: Oral ETT  Additional Equipment: None  Intra-op Plan:   Post-operative Plan: Extubation in OR  Informed Consent: I have reviewed the patients History and Physical, chart, labs and discussed the procedure including the risks, benefits and alternatives for the proposed anesthesia with the patient or authorized representative who has indicated his/her understanding and acceptance.   Dental advisory given  Plan Discussed with: CRNA, Surgeon and Anesthesiologist  Anesthesia Plan Comments:        Anesthesia Quick Evaluation

## 2014-10-06 NOTE — H&P (Signed)
Tammy Velez 09/20/2014 11:27 AM Location: [facility] Patient #: [id number] DOB: [birth year] Married / Language: English / Race: White Female  History of Present Illness Tammy Velez. [name] MD; 09/20/2014 11:40 AM) Patient words: Gallbladder.  The patient is a 49 year old female who presents for a follow-up for Abdominal pain. The patient is a 49 year old female who we saw 8 months ago with symptomatic gallstones unfortunately she was unable to schedule surgery at that time. She continues to have upper abdominal discomfort that is constant. She has nausea but no vomiting. She is now ready to schedule surgery.   Allergies Elbert Ewings, CMA; 09/20/2014 11:27 AM) No Known Drug Allergies12/29/2015  Medication History Elbert Ewings, CMA; 09/20/2014 11:28 AM) Lipitor (20MG  Tablet, Oral) Active. Advair Diskus (250-50MCG/DOSE Aero Pow Br Act, Inhalation as needed) Active. Brisdelle (7.5MG  Capsule, Oral) Active. Medications Reconciled PARoxetine Mesylate (7.5MG  Capsule, Oral) Active.  Review of Systems Eddie Dibbles S. [name] MD; 09/20/2014 11:40 AM) General Not Present- Appetite Loss, Chills, Fatigue, Fever, Night Sweats, Weight Gain and Weight Loss. Skin Not Present- Change in Wart/Mole, Dryness, Hives, Jaundice, New Lesions, Non-Healing Wounds, Rash and Ulcer. HEENT Not Present- Earache, Hearing Loss, Hoarseness, Nose Bleed, Oral Ulcers, Ringing in the Ears, Seasonal Allergies, Sinus Pain, Sore Throat, Visual Disturbances, Wears glasses/contact lenses and Yellow Eyes. Respiratory Not Present- Bloody sputum, Chronic Cough, Difficulty Breathing, Snoring and Wheezing. Breast Not Present- Breast Mass, Breast Pain, Nipple Discharge and Skin Changes. Cardiovascular Not Present- Chest Pain, Difficulty Breathing Lying Down, Leg Cramps, Palpitations, Rapid Heart Rate, Shortness of Breath and Swelling of Extremities. Gastrointestinal Not Present- Abdominal Pain, Bloating, Bloody Stool, Change in Bowel  Habits, Chronic diarrhea, Constipation, Difficulty Swallowing, Excessive gas, Gets full quickly at meals, Hemorrhoids, Indigestion, Nausea, Rectal Pain and Vomiting. Female Genitourinary Not Present- Frequency, Nocturia, Painful Urination, Pelvic Pain and Urgency. Musculoskeletal Present- Back Pain. Not Present- Joint Pain, Joint Stiffness, Muscle Pain, Muscle Weakness and Swelling of Extremities. Neurological Not Present- Decreased Memory, Fainting, Headaches, Numbness, Seizures, Tingling, Tremor, Trouble walking and Weakness. Psychiatric Not Present- Anxiety, Bipolar, Change in Sleep Pattern, Depression, Fearful and Frequent crying. Endocrine Not Present- Cold Intolerance, Excessive Hunger, Hair Changes, Heat Intolerance, Hot flashes and New Diabetes. Hematology Not Present- Easy Bruising, Excessive bleeding, Gland problems, HIV and Persistent Infections.   Vitals Elbert Ewings CMA; 09/20/2014 11:28 AM) 09/20/2014 11:28 AM Weight: 258 lb Height: 64in Body Surface Area: 2.3 m Body Mass Index: 44.29 kg/m Temp.: 98.62F(Oral)  Pulse: 86 (Regular)  BP: 134/68 (Sitting, Left Arm, Standard)    Physical Exam Eddie Dibbles S. [name] MD; 09/20/2014 11:40 AM) General Mental Status-Alert. General Appearance-Consistent with stated age. Hydration-Well hydrated. Voice-Normal.  Head and Neck Head-normocephalic, atraumatic with no lesions or palpable masses. Trachea-midline. Thyroid Gland Characteristics - normal size and consistency.  Eye Eyeball - Bilateral-Extraocular movements intact. Sclera/Conjunctiva - Bilateral-No scleral icterus.  Chest and Lung Exam Chest and lung exam reveals -quiet, even and easy respiratory effort with no use of accessory muscles and on auscultation, normal breath sounds, no adventitious sounds and normal vocal resonance. Inspection Chest Wall - Normal. Back - normal.  Cardiovascular Cardiovascular examination reveals -normal heart sounds,  regular rate and rhythm with no murmurs and normal pedal pulses bilaterally.  Abdomen Note: There is mild to moderate right upper quadrant and epigastric discomfort. There is no palpable mass.   Neurologic Neurologic evaluation reveals -alert and oriented x 3 with no impairment of recent or remote memory. Mental Status-Normal.  Musculoskeletal Normal Exam - Left-Upper Extremity Strength Normal and  Lower Extremity Strength Normal. Normal Exam - Right-Upper Extremity Strength Normal and Lower Extremity Strength Normal.  Lymphatic Head & Neck  General Head & Neck Lymphatics: Bilateral - Description - Normal. Axillary  General Axillary Region: Bilateral - Description - Normal. Tenderness - Non Tender. Femoral & Inguinal  Generalized Femoral & Inguinal Lymphatics: Bilateral - Description - Normal. Tenderness - Non Tender.    Assessment & Plan Eddie Dibbles S. [name] MD; 09/20/2014 11:36 AM) GALLSTONES (574.20  K80.20) Impression: The patient appears to have symptomatic gallstones. We saw her about 8 months ago but she was unable to schedule surgery at that time. She is ready to schedule surgery now. I have discussed with her in detail the risks and benefits of the operation to remove the gallbladder as well as some of the technical aspects and she understands and wishes to proceed. I will plan for a laparoscopic cholecystectomy with intraoperative cholangiogram Current Plans  Pt Education - Gallstones: discussed with patient and provided information.   Signed by Luella Cook, MD (09/20/2014 11:41 AM)

## 2014-10-06 NOTE — Anesthesia Procedure Notes (Signed)
Procedure Name: Intubation Date/Time: 10/06/2014 9:30 AM Performed by: Garrison Columbus T Pre-anesthesia Checklist: Patient identified, Emergency Drugs available, Suction available and Patient being monitored Patient Re-evaluated:Patient Re-evaluated prior to inductionOxygen Delivery Method: Circle system utilized Preoxygenation: Pre-oxygenation with 100% oxygen Intubation Type: IV induction and Rapid sequence Laryngoscope Size: 2 and Miller Grade View: Grade I Tube type: Oral Tube size: 7.5 mm Number of attempts: 1 Airway Equipment and Method: Stylet Placement Confirmation: ETT inserted through vocal cords under direct vision,  positive ETCO2 and breath sounds checked- equal and bilateral Secured at: 22 cm Tube secured with: Tape Dental Injury: Teeth and Oropharynx as per pre-operative assessment

## 2014-10-06 NOTE — Op Note (Signed)
10/06/2014  10:42 AM  PATIENT:  Tammy Velez  49 y.o. female  PRE-OPERATIVE DIAGNOSIS:  gallstones  POST-OPERATIVE DIAGNOSIS:  gallstones  PROCEDURE:  Procedure(s): LAPAROSCOPIC CHOLECYSTECTOMY WITH INTRAOPERATIVE CHOLANGIOGRAM (N/A)  SURGEON:  Surgeon(s) and Role:    * Jovita Kussmaul, MD - Primary  PHYSICIAN ASSISTANT:   ASSISTANTS: Sharyn Dross, RNFA   ANESTHESIA:   general  EBL:  Total I/O In: 1200 [I.V.:1200] Out: 20 [Blood:20]  BLOOD ADMINISTERED:none  DRAINS: none   LOCAL MEDICATIONS USED:  MARCAINE     SPECIMEN:  Source of Specimen:  gallbladder  DISPOSITION OF SPECIMEN:  PATHOLOGY  COUNTS:  YES  TOURNIQUET:  * No tourniquets in log *  DICTATION: .Dragon Dictation   Procedure: After informed consent was obtained the patient was brought to the operating room and placed in the supine position on the operating room table. After adequate induction of general anesthesia the patient's abdomen was prepped with ChloraPrep allowed to dry and draped in usual sterile manner. The area below the umbilicus was infiltrated with quarter percent  Marcaine. A small incision was made with a 15 blade knife. The incision was carried down through the subcutaneous tissue bluntly with a hemostat and [employer]-Nav[employer] retractors. The linea alba was identified. The linea alba was incised with a 15 blade knife and each side was grasped with Coker clamps. The preperitoneal space was then probed with a hemostat until the peritoneum was opened and access was gained to the abdominal cavity. A 0 Vicryl pursestring stitch was placed in the fascia surrounding the opening. A Hassan cannula was then placed through the opening and anchored in place with the previously placed Vicryl purse string stitch. The abdomen was insufflated with carbon dioxide without difficulty. A laparoscope was inserted through the [facility] cannula in the right upper quadrant was inspected. Next the epigastric region was infiltrated  with % Marcaine. A small incision was made with a 15 blade knife. A 5 mm port was placed bluntly through this incision into the abdominal cavity under direct vision. Next 2 sites were chosen laterally on the right side of the abdomen for placement of 5 mm ports. Each of these areas was infiltrated with quarter percent Marcaine. Small stab incisions were made with a 15 blade knife. 5 mm ports were then placed bluntly through these incisions into the abdominal cavity under direct vision without difficulty. A blunt grasper was placed through the lateralmost 5 mm port and used to grasp the dome of the gallbladder and elevated anteriorly and superiorly. Another blunt grasper was placed through the other 5 mm port and used to retract the body and neck of the gallbladder. A dissector was placed through the epigastric port and using the electrocautery the peritoneal reflection at the gallbladder neck was opened. Blunt dissection was then carried out in this area until the gallbladder neck-cystic duct junction was readily identified and a good window was created. A single clip was placed on the gallbladder neck. A small  ductotomy was made just below the clip with laparoscopic scissors. A 14-gauge Angiocath was then placed through the anterior abdominal wall under direct vision. A Reddick cholangiogram catheter was then placed through the Angiocath and flushed. The catheter was then placed in the cystic duct and anchored in place with a clip. A cholangiogram was obtained that showed no filling defects good emptying into the duodenum an adequate length on the cystic duct. The anchoring clip and catheters were then removed from the patient. 3 clips were placed  proximally on the cystic duct and the duct was divided between the 2 sets of clips. Posterior to this the cystic artery was identified and again dissected bluntly in a circumferential manner until a good window  was created. 2 clips were placed proximally and one  distally on the artery and the artery was divided between the 2 sets of clips. Next a laparoscopic hook cautery device was used to separate the gallbladder from the liver bed. Prior to completely detaching the gallbladder from the liver bed the liver bed was inspected and several small bleeding points were coagulated with the electrocautery until the area was completely hemostatic. The gallbladder was then detached the rest of it from the liver bed without difficulty. A laparoscopic bag was inserted through the hassan port. The laparoscope was moved to the epigastric port. The gallbladder was placed within the bag and the bag was sealed.  The bag with the gallbladder was then removed with the [facility] cannula through the infraumbilical port without difficulty. The fascial defect was then closed with the previously placed Vicryl pursestring stitch as well as with another figure-of-eight 0 Vicryl stitch. The liver bed was inspected again and found to be hemostatic. The abdomen was irrigated with copious amounts of saline until the effluent was clear. The ports were then removed under direct vision without difficulty and were found to be hemostatic. The gas was allowed to escape. The skin incisions were all closed with interrupted 4-0 Monocryl subcuticular stitches. Dermabond dressings were applied. The patient tolerated the procedure well. At the end of the case all needle sponge and instrument counts were correct. The patient was then awakened and taken to recovery in stable condition  PLAN OF CARE: Discharge to home after PACU  PATIENT DISPOSITION:  PACU - hemodynamically stable.   Delay start of Pharmacological VTE agent (>24hrs) due to surgical blood loss or risk of bleeding: not applicable

## 2014-10-10 ENCOUNTER — Encounter (HOSPITAL_COMMUNITY): Payer: Self-pay | Admitting: General Surgery

## 2014-10-10 NOTE — Anesthesia Postprocedure Evaluation (Signed)
  Anesthesia Post-op Note  Patient: Tammy Velez  Procedure(s) Performed: Procedure(s): LAPAROSCOPIC CHOLECYSTECTOMY WITH INTRAOPERATIVE CHOLANGIOGRAM (N/A)  Patient Location: PACU  Anesthesia Type:General  Level of Consciousness: awake  Airway and Oxygen Therapy: Patient Spontanous Breathing  Post-op Pain: mild  Post-op Assessment: Post-op Vital signs reviewed, Patient's Cardiovascular Status Stable, Respiratory Function Stable, Patent Airway, No signs of Nausea or vomiting and Pain level controlled              Post-op Vital Signs: Reviewed and stable  Last Vitals:  Filed Vitals:   10/06/14 1255  BP: 138/66  Pulse: 100  Temp:   Resp:     Complications: No apparent anesthesia complications

## 2015-03-08 ENCOUNTER — Other Ambulatory Visit: Payer: Self-pay | Admitting: Family Medicine

## 2015-03-08 ENCOUNTER — Ambulatory Visit
Admission: RE | Admit: 2015-03-08 | Discharge: 2015-03-08 | Disposition: A | Discharge status: Home / Self Care | Payer: Managed Care, Other (non HMO) | Source: Ambulatory Visit | Attending: Family Medicine | Admitting: Family Medicine

## 2015-03-08 DIAGNOSIS — R1031 Right lower quadrant pain: Secondary | ICD-10-CM

## 2015-03-08 MED ORDER — IOPAMIDOL (ISOVUE-300) INJECTION 61%
125.0000 mL | Freq: Once | INTRAVENOUS | Status: AC | PRN
  Administered 2015-03-08: 125 mL via INTRAVENOUS

## 2015-10-03 ENCOUNTER — Other Ambulatory Visit: Payer: Self-pay | Admitting: Physician Assistant

## 2015-10-03 ENCOUNTER — Ambulatory Visit
Admission: RE | Admit: 2015-10-03 | Discharge: 2015-10-03 | Disposition: A | Discharge status: Home / Self Care | Payer: Managed Care, Other (non HMO) | Source: Ambulatory Visit | Attending: Physician Assistant | Admitting: Physician Assistant

## 2015-10-03 DIAGNOSIS — R1031 Right lower quadrant pain: Secondary | ICD-10-CM

## 2015-10-03 MED ORDER — IOPAMIDOL (ISOVUE-300) INJECTION 61%
100.0000 mL | Freq: Once | INTRAVENOUS | Status: AC | PRN
  Administered 2015-10-03: 100 mL via INTRAVENOUS

## 2016-01-09 ENCOUNTER — Other Ambulatory Visit: Payer: Self-pay | Admitting: Obstetrics and Gynecology

## 2016-01-23 NOTE — Patient Instructions (Signed)
Your procedure is scheduled on:  Thursday, Dec. 28, 2017  Enter through the [employer] [facility] at:  11:00 AM  Pick up the phone at the desk and dial [phone].  Call this number if you have problems the morning of surgery: [phone].  Remember: Do NOT eat food or drink after:  Midnight Wednesday, Dec. 27, 2017  Take these medicines the morning of surgery with a SIP OF WATER:  None  Use inhalers per normal routine  Bring rescue inhaler day of surgery  Stop ALL herbal medications at this time   Do NOT wear jewelry (body piercing), metal hair clips/bobby pins, make-up, or nail polish. Do NOT wear lotions, powders, or perfumes.  You may wear deodorant. Do NOT shave for 48 hours prior to surgery. Do NOT bring valuables to the hospital. Contacts, dentures, or bridgework may not be worn into surgery.  Leave suitcase in car.  After surgery it may be brought to your room.  For patients admitted to the hospital, checkout time is 11:00 AM the day of discharge.

## 2016-01-24 ENCOUNTER — Inpatient Hospital Stay (HOSPITAL_COMMUNITY)
Admission: RE | Admit: 2016-01-24 | Discharge: 2016-01-24 | Disposition: A | Discharge status: Home / Self Care | Payer: Managed Care, Other (non HMO) | Source: Ambulatory Visit

## 2016-01-29 ENCOUNTER — Encounter (HOSPITAL_COMMUNITY)
Admission: RE | Admit: 2016-01-29 | Discharge: 2016-01-29 | Disposition: A | Discharge status: Home / Self Care | Payer: Managed Care, Other (non HMO) | Source: Ambulatory Visit | Attending: Obstetrics and Gynecology | Admitting: Obstetrics and Gynecology

## 2016-01-29 ENCOUNTER — Encounter (HOSPITAL_COMMUNITY): Payer: Self-pay

## 2016-01-29 DIAGNOSIS — Z01812 Encounter for preprocedural laboratory examination: Secondary | ICD-10-CM

## 2016-01-29 DIAGNOSIS — E119 Type 2 diabetes mellitus without complications: Secondary | ICD-10-CM | POA: Diagnosis not present

## 2016-01-29 DIAGNOSIS — D251 Intramural leiomyoma of uterus: Secondary | ICD-10-CM | POA: Diagnosis not present

## 2016-01-29 DIAGNOSIS — N815 Vaginal enterocele: Secondary | ICD-10-CM | POA: Diagnosis not present

## 2016-01-29 DIAGNOSIS — N946 Dysmenorrhea, unspecified: Secondary | ICD-10-CM | POA: Diagnosis not present

## 2016-01-29 DIAGNOSIS — N83201 Unspecified ovarian cyst, right side: Secondary | ICD-10-CM | POA: Diagnosis not present

## 2016-01-29 DIAGNOSIS — N800 Endometriosis of uterus: Secondary | ICD-10-CM | POA: Diagnosis not present

## 2016-01-29 DIAGNOSIS — N921 Excessive and frequent menstruation with irregular cycle: Secondary | ICD-10-CM | POA: Diagnosis not present

## 2016-01-29 DIAGNOSIS — J45909 Unspecified asthma, uncomplicated: Secondary | ICD-10-CM | POA: Diagnosis not present

## 2016-01-29 DIAGNOSIS — N736 Female pelvic peritoneal adhesions (postinfective): Secondary | ICD-10-CM | POA: Diagnosis not present

## 2016-01-29 DIAGNOSIS — Z23 Encounter for immunization: Secondary | ICD-10-CM | POA: Diagnosis not present

## 2016-01-29 DIAGNOSIS — Z6841 Body Mass Index (BMI) 40.0 and over, adult: Secondary | ICD-10-CM | POA: Diagnosis not present

## 2016-01-29 HISTORY — DX: Acute upper respiratory infection, unspecified: J06.9

## 2016-01-29 LAB — BASIC METABOLIC PANEL
ANION GAP: 7 (ref 5–15)
BUN: 17 mg/dL (ref 6–20)
CHLORIDE: 105 mmol/L (ref 101–111)
CO2: 26 mmol/L (ref 22–32)
Calcium: 8.5 mg/dL — ABNORMAL LOW (ref 8.9–10.3)
Creatinine, Ser: 0.85 mg/dL (ref 0.44–1.00)
GFR calc non Af Amer: >60 mL/min (ref >60)
Glucose, Bld: 108 mg/dL — ABNORMAL HIGH (ref 65–99)
POTASSIUM: 4 mmol/L (ref 3.5–5.1)
SODIUM: 138 mmol/L (ref 135–145)

## 2016-01-29 LAB — ABO/RH: ABO/RH(D): A POS

## 2016-01-29 LAB — CBC
HEMATOCRIT: 40.5 % (ref 36.0–46.0)
HEMOGLOBIN: 13.7 g/dL (ref 12.0–15.0)
MCH: 31.4 pg (ref 26.0–34.0)
MCHC: 33.8 g/dL (ref 30.0–36.0)
MCV: 92.7 fL (ref 78.0–100.0)
Platelets: 392 10*3/uL (ref 150–400)
RBC: 4.37 MIL/uL (ref 3.87–5.11)
RDW: 12.9 % (ref 11.5–15.5)
WBC: 8.6 10*3/uL (ref 4.0–10.5)

## 2016-01-29 LAB — TYPE AND SCREEN
ABO/RH(D): A POS
ANTIBODY SCREEN: NEGATIVE

## 2016-01-29 NOTE — Patient Instructions (Addendum)
Your procedure is scheduled on: December 28,2017  Enter through the Main Entrance of [facility] at: 11:15  Pick up the phone at the desk and dial [phone].  Call this number if you have problems the morning of surgery: [phone].  Bring albuterol inhaler with you day of surgery  Remember: Do NOT eat food: after midnight on Wednesday December 27 Do NOT drink clear liquids after: 8:45 am day of surgery Take these medicines the morning of surgery with a SIP OF WATER: none  Do NOT wear jewelry (body piercing), metal hair clips/bobby pins, make-up, or nail polish. Do NOT wear lotions, powders, or perfumes.  You may wear deoderant. Do NOT shave for 48 hours prior to surgery. Do NOT bring valuables to the hospital. Contacts, dentures, or bridgework may not be worn into surgery. Leave suitcase in car.  After surgery it may be brought to your room.  For patients admitted to the hospital, checkout time is 11:00 AM the day of discharge. Have a responsible adult drive you home and stay with you for 24 hours after your procedure

## 2016-01-30 MED ORDER — DEXTROSE 5 % IV SOLN
2.0000 g | INTRAVENOUS | Status: AC
  Administered 2016-01-31: 2 g via INTRAVENOUS
  Filled 2016-01-30: qty 2

## 2016-01-30 NOTE — H&P (Deleted)
  The note originally documented on this encounter has been moved the the encounter in which it belongs.  

## 2016-01-30 NOTE — H&P (Signed)
NAMESHERIN, LEDINGHAM               ACCOUNT NO.:  [id number]  MEDICAL RECORD NO.:  [id number]  LOCATION:                                 FACILITY:  PHYSICIAN:  Lovenia Kim, M.D.DATE OF BIRTH:  07/22/1965  DATE OF ADMISSION:  01/31/2016 DATE OF DISCHARGE:                             HISTORY & PHYSICAL   CHIEF COMPLAINT:  Refractory menorrhagia with failed endometrial ablation.  HISTORY OF PRESENT ILLNESS:  This is a 50 year old white female, G2, P2, presents with refractory menorrhagia, failed endometrial ablation for definitive therapy.  ALLERGIES:  She has no known drug allergies.  PAST MEDICAL HISTORY:  Remarkable for obesity and asthma.  MEDICATIONS:  Include Brisdelle and Lipitor.  SOCIAL HISTORY:  She is a nonsmoker and nondrinker.  She denies domestic or physical violence.  OBSTETRIC HISTORY:  Remarkable for a C-section x2.  FAMILY HISTORY:  Heart disease, pancreatic cancer, and hypertension.  PAST SURGICAL HISTORY:  She has a surgical history also remarkable for D and C and ablation, history of kidney stent placement, history of cholecystectomy, and back surgery.  PHYSICAL EXAMINATION:  GENERAL:  The patient is an obese white female in no acute distress. HEENT:  Normal. NECK:  Supple.  Full range of motion. LUNGS:  Clear. HEART:  Regular rate and rhythm. ABDOMEN:  Soft and nontender. PELVIC:  A bulky anteflexed uterus and no adnexal masses. EXTREMITIES:  No cords. NEUROLOGIC:  Nonfocal. SKIN:  Intact.  IMPRESSION:  Failed endometrial ablation with refractory menometrorrhagia for definitive therapy.  PLAN:  Proceed with da Vinci total laparoscopic hysterectomy and bilateral salpingectomy.  Risks of anesthesia, infection, bleeding, surrounding organs, possible need for repair discussed, delayed versus immediate complications to include bowel and bladder injury noted.  The patient acknowledges and wishes to proceed.     Lovenia Kim,  M.D.   ______________________________ Lovenia Kim, M.D.    RJT/MEDQ  D:  01/30/2016  T:  01/30/2016  Job:  [id number]

## 2016-01-31 ENCOUNTER — Encounter (HOSPITAL_COMMUNITY): Payer: Self-pay | Admitting: Anesthesiology

## 2016-01-31 ENCOUNTER — Encounter (HOSPITAL_COMMUNITY)
Admission: RE | Disposition: A | Discharge status: Home / Self Care | Payer: Self-pay | Source: Ambulatory Visit | Attending: Obstetrics and Gynecology

## 2016-01-31 ENCOUNTER — Ambulatory Visit (HOSPITAL_COMMUNITY): Payer: Managed Care, Other (non HMO) | Admitting: Anesthesiology

## 2016-01-31 ENCOUNTER — Ambulatory Visit (HOSPITAL_COMMUNITY)
Admission: RE | Admit: 2016-01-31 | Discharge: 2016-02-01 | Disposition: A | Discharge status: Home / Self Care | Payer: Managed Care, Other (non HMO) | Source: Ambulatory Visit | Attending: Obstetrics and Gynecology | Admitting: Obstetrics and Gynecology

## 2016-01-31 DIAGNOSIS — D251 Intramural leiomyoma of uterus: Secondary | ICD-10-CM | POA: Insufficient documentation

## 2016-01-31 DIAGNOSIS — J45909 Unspecified asthma, uncomplicated: Secondary | ICD-10-CM | POA: Insufficient documentation

## 2016-01-31 DIAGNOSIS — N736 Female pelvic peritoneal adhesions (postinfective): Secondary | ICD-10-CM | POA: Insufficient documentation

## 2016-01-31 DIAGNOSIS — N921 Excessive and frequent menstruation with irregular cycle: Secondary | ICD-10-CM | POA: Diagnosis not present

## 2016-01-31 DIAGNOSIS — Z23 Encounter for immunization: Secondary | ICD-10-CM | POA: Insufficient documentation

## 2016-01-31 DIAGNOSIS — N946 Dysmenorrhea, unspecified: Secondary | ICD-10-CM

## 2016-01-31 DIAGNOSIS — N800 Endometriosis of uterus: Secondary | ICD-10-CM | POA: Insufficient documentation

## 2016-01-31 DIAGNOSIS — E119 Type 2 diabetes mellitus without complications: Secondary | ICD-10-CM | POA: Insufficient documentation

## 2016-01-31 DIAGNOSIS — N83201 Unspecified ovarian cyst, right side: Secondary | ICD-10-CM | POA: Insufficient documentation

## 2016-01-31 DIAGNOSIS — N815 Vaginal enterocele: Secondary | ICD-10-CM | POA: Insufficient documentation

## 2016-01-31 DIAGNOSIS — Z6841 Body Mass Index (BMI) 40.0 and over, adult: Secondary | ICD-10-CM | POA: Insufficient documentation

## 2016-01-31 HISTORY — DX: Dysmenorrhea, unspecified: N94.6

## 2016-01-31 HISTORY — PX: ROBOTIC ASSISTED TOTAL HYSTERECTOMY WITH SALPINGECTOMY: SHX6679

## 2016-01-31 LAB — HCG, SERUM, QUALITATIVE: Preg, Serum: NEGATIVE

## 2016-01-31 SURGERY — ROBOTIC ASSISTED TOTAL HYSTERECTOMY WITH SALPINGECTOMY
Anesthesia: General | Site: Abdomen | Laterality: Bilateral

## 2016-01-31 MED ORDER — SUGAMMADEX SODIUM 500 MG/5ML IV SOLN
INTRAVENOUS | Status: AC
  Filled 2016-01-31: qty 5

## 2016-01-31 MED ORDER — SCOPOLAMINE 1 MG/3DAYS TD PT72
1.0000 | MEDICATED_PATCH | Freq: Once | TRANSDERMAL | Status: DC
  Administered 2016-01-31: 1.5 mg via TRANSDERMAL

## 2016-01-31 MED ORDER — LIDOCAINE HCL (CARDIAC) 20 MG/ML IV SOLN
INTRAVENOUS | Status: AC
  Filled 2016-01-31: qty 5

## 2016-01-31 MED ORDER — NALOXONE HCL 0.4 MG/ML IJ SOLN: 0.4000 mg | INTRAMUSCULAR | Status: DC | PRN

## 2016-01-31 MED ORDER — ROPIVACAINE HCL 5 MG/ML IJ SOLN
INTRAMUSCULAR | Status: DC | PRN
  Administered 2016-01-31: 60 mL

## 2016-01-31 MED ORDER — MIDAZOLAM HCL 2 MG/2ML IJ SOLN
INTRAMUSCULAR | Status: DC | PRN
Start: 2016-01-31 — End: 2016-01-31
  Administered 2016-01-31: 1 mg via INTRAVENOUS

## 2016-01-31 MED ORDER — HYDROMORPHONE 1 MG/ML IV SOLN
INTRAVENOUS | Status: DC
  Administered 2016-01-31: 2.4 mg via INTRAVENOUS
  Administered 2016-01-31: 19:00:00 via INTRAVENOUS
  Administered 2016-02-01: 0.4 mg via INTRAVENOUS
  Administered 2016-02-01: 0.6 mg via INTRAVENOUS
  Administered 2016-02-01: 0.2 mg via INTRAVENOUS
  Filled 2016-01-31: qty 25

## 2016-01-31 MED ORDER — MIDAZOLAM HCL 2 MG/2ML IJ SOLN
INTRAMUSCULAR | Status: AC
  Filled 2016-01-31: qty 2

## 2016-01-31 MED ORDER — OXYCODONE-ACETAMINOPHEN 5-325 MG PO TABS
1.0000 | ORAL_TABLET | ORAL | Status: DC | PRN
  Administered 2016-02-01: 1 via ORAL
  Filled 2016-01-31: qty 1

## 2016-01-31 MED ORDER — HYDROMORPHONE HCL 1 MG/ML IJ SOLN
0.2500 mg | INTRAMUSCULAR | Status: DC | PRN
  Administered 2016-01-31: 0.25 mg via INTRAVENOUS
  Administered 2016-01-31 (×2): 0.5 mg via INTRAVENOUS
  Administered 2016-01-31: 0.25 mg via INTRAVENOUS

## 2016-01-31 MED ORDER — STERILE WATER FOR IRRIGATION IR SOLN
Status: DC | PRN
  Administered 2016-01-31: 200 mL

## 2016-01-31 MED ORDER — ROCURONIUM BROMIDE 100 MG/10ML IV SOLN
INTRAVENOUS | Status: DC | PRN
  Administered 2016-01-31: 30 mg via INTRAVENOUS
  Administered 2016-01-31: 20 mg via INTRAVENOUS
  Administered 2016-01-31: 60 mg via INTRAVENOUS
  Administered 2016-01-31: 10 mg via INTRAVENOUS

## 2016-01-31 MED ORDER — SUGAMMADEX SODIUM 500 MG/5ML IV SOLN
INTRAVENOUS | Status: DC | PRN
  Administered 2016-01-31: 250 mg via INTRAVENOUS

## 2016-01-31 MED ORDER — LACTATED RINGERS IV SOLN
INTRAVENOUS | Status: DC
  Administered 2016-01-31 (×2): via INTRAVENOUS
  Administered 2016-01-31: 125 mL/h via INTRAVENOUS
  Administered 2016-01-31: 16:00:00 via INTRAVENOUS

## 2016-01-31 MED ORDER — LACTATED RINGERS IR SOLN
Status: DC | PRN
  Administered 2016-01-31: 3000 mL

## 2016-01-31 MED ORDER — DEXAMETHASONE SODIUM PHOSPHATE 10 MG/ML IJ SOLN
INTRAMUSCULAR | Status: DC | PRN
  Administered 2016-01-31: 4 mg via INTRAVENOUS

## 2016-01-31 MED ORDER — HYDROMORPHONE HCL 1 MG/ML IJ SOLN
INTRAMUSCULAR | Status: AC
  Administered 2016-01-31: 0.25 mg via INTRAVENOUS
  Filled 2016-01-31: qty 1

## 2016-01-31 MED ORDER — ONDANSETRON HCL 4 MG/2ML IJ SOLN
INTRAMUSCULAR | Status: DC | PRN
  Administered 2016-01-31: 4 mg via INTRAVENOUS

## 2016-01-31 MED ORDER — ONDANSETRON HCL 4 MG/2ML IJ SOLN
INTRAMUSCULAR | Status: AC
  Filled 2016-01-31: qty 2

## 2016-01-31 MED ORDER — PROPOFOL 10 MG/ML IV BOLUS
INTRAVENOUS | Status: DC | PRN
  Administered 2016-01-31: 200 mg via INTRAVENOUS

## 2016-01-31 MED ORDER — PROMETHAZINE HCL 25 MG/ML IJ SOLN: 6.2500 mg | INTRAMUSCULAR | Status: DC | PRN

## 2016-01-31 MED ORDER — ONDANSETRON HCL 4 MG/2ML IJ SOLN
4.0000 mg | Freq: Four times a day (QID) | INTRAMUSCULAR | Status: DC | PRN
  Administered 2016-01-31: 4 mg via INTRAVENOUS
  Filled 2016-01-31: qty 2

## 2016-01-31 MED ORDER — FENTANYL CITRATE (PF) 250 MCG/5ML IJ SOLN
INTRAMUSCULAR | Status: AC
  Filled 2016-01-31: qty 5

## 2016-01-31 MED ORDER — FENTANYL CITRATE (PF) 100 MCG/2ML IJ SOLN
INTRAMUSCULAR | Status: DC | PRN
  Administered 2016-01-31 (×10): 50 ug via INTRAVENOUS

## 2016-01-31 MED ORDER — KETOROLAC TROMETHAMINE 30 MG/ML IJ SOLN
INTRAMUSCULAR | Status: AC
  Filled 2016-01-31: qty 1

## 2016-01-31 MED ORDER — ROPIVACAINE HCL 5 MG/ML IJ SOLN
INTRAMUSCULAR | Status: AC
  Filled 2016-01-31: qty 30

## 2016-01-31 MED ORDER — DEXTROSE IN LACTATED RINGERS 5 % IV SOLN
INTRAVENOUS | Status: DC
  Administered 2016-01-31: 20:00:00 via INTRAVENOUS

## 2016-01-31 MED ORDER — DEXAMETHASONE SODIUM PHOSPHATE 4 MG/ML IJ SOLN
INTRAMUSCULAR | Status: AC
  Filled 2016-01-31: qty 1

## 2016-01-31 MED ORDER — LIDOCAINE HCL (CARDIAC) 20 MG/ML IV SOLN
INTRAVENOUS | Status: DC | PRN
  Administered 2016-01-31: 70 mg via INTRAVENOUS
  Administered 2016-01-31: 30 mg via INTRAVENOUS

## 2016-01-31 MED ORDER — DIPHENHYDRAMINE HCL 12.5 MG/5ML PO ELIX: 12.5000 mg | ORAL_SOLUTION | Freq: Four times a day (QID) | ORAL | Status: DC | PRN

## 2016-01-31 MED ORDER — BUPIVACAINE HCL (PF) 0.25 % IJ SOLN
INTRAMUSCULAR | Status: AC
  Filled 2016-01-31: qty 30

## 2016-01-31 MED ORDER — METHYLENE BLUE 0.5 % INJ SOLN
INTRAVENOUS | Status: AC
  Filled 2016-01-31: qty 10

## 2016-01-31 MED ORDER — HYDROMORPHONE HCL 1 MG/ML IJ SOLN
INTRAMUSCULAR | Status: AC
  Administered 2016-01-31: 0.5 mg via INTRAVENOUS
  Filled 2016-01-31: qty 1

## 2016-01-31 MED ORDER — DIPHENHYDRAMINE HCL 50 MG/ML IJ SOLN: 12.5000 mg | Freq: Four times a day (QID) | INTRAMUSCULAR | Status: DC | PRN

## 2016-01-31 MED ORDER — SODIUM CHLORIDE 0.9 % IJ SOLN
INTRAMUSCULAR | Status: AC
  Filled 2016-01-31: qty 30

## 2016-01-31 MED ORDER — ROCURONIUM BROMIDE 100 MG/10ML IV SOLN
INTRAVENOUS | Status: AC
  Filled 2016-01-31: qty 1

## 2016-01-31 MED ORDER — SODIUM CHLORIDE 0.9% FLUSH: 9.0000 mL | INTRAVENOUS | Status: DC | PRN

## 2016-01-31 MED ORDER — LABETALOL HCL 5 MG/ML IV SOLN
INTRAVENOUS | Status: DC | PRN
  Administered 2016-01-31 (×2): 5 mg via INTRAVENOUS

## 2016-01-31 MED ORDER — LABETALOL HCL 5 MG/ML IV SOLN
INTRAVENOUS | Status: AC
  Filled 2016-01-31: qty 4

## 2016-01-31 MED ORDER — TRAMADOL HCL 50 MG PO TABS: 50.0000 mg | ORAL_TABLET | Freq: Four times a day (QID) | ORAL | Status: DC | PRN

## 2016-01-31 MED ORDER — SCOPOLAMINE 1 MG/3DAYS TD PT72
MEDICATED_PATCH | TRANSDERMAL | Status: AC
  Administered 2016-01-31: 1.5 mg via TRANSDERMAL
  Filled 2016-01-31: qty 1

## 2016-01-31 MED ORDER — KETOROLAC TROMETHAMINE 30 MG/ML IJ SOLN
INTRAMUSCULAR | Status: DC | PRN
  Administered 2016-01-31: 30 mg via INTRAVENOUS

## 2016-01-31 MED ORDER — PROPOFOL 10 MG/ML IV BOLUS
INTRAVENOUS | Status: AC
  Filled 2016-01-31: qty 20

## 2016-01-31 SURGICAL SUPPLY — 56 items
ADH SKN CLS APL DERMABOND .7 (GAUZE/BANDAGES/DRESSINGS) ×1
BARRIER ADHS 3X4 INTERCEED (GAUZE/BANDAGES/DRESSINGS) IMPLANT
BRR ADH 4X3 ABS CNTRL BYND (GAUZE/BANDAGES/DRESSINGS)
CATH FOLEY 3WAY  5CC 16FR (CATHETERS) ×2
CATH FOLEY 3WAY 5CC 16FR (CATHETERS) ×1 IMPLANT
CLOTH BEACON ORANGE TIMEOUT ST (SAFETY) ×3 IMPLANT
CONT PATH 16OZ SNAP LID 3702 (MISCELLANEOUS) ×3 IMPLANT
COVER BACK TABLE 60X90IN (DRAPES) ×6 IMPLANT
COVER TIP SHEARS 8 DVNC (MISCELLANEOUS) ×1 IMPLANT
COVER TIP SHEARS 8MM DA VINCI (MISCELLANEOUS) ×2
DECANTER SPIKE VIAL GLASS SM (MISCELLANEOUS) ×9 IMPLANT
DERMABOND ADVANCED (GAUZE/BANDAGES/DRESSINGS) ×2
DERMABOND ADVANCED .7 DNX12 (GAUZE/BANDAGES/DRESSINGS) ×1 IMPLANT
DURAPREP 26ML APPLICATOR (WOUND CARE) ×3 IMPLANT
ELECT REM PT RETURN 9FT ADLT (ELECTROSURGICAL) ×3
ELECTRODE REM PT RTRN 9FT ADLT (ELECTROSURGICAL) ×1 IMPLANT
GAUZE VASELINE 3X9 (GAUZE/BANDAGES/DRESSINGS) IMPLANT
GLOVE BIO SURGEON STRL SZ7.5 (GLOVE) ×9 IMPLANT
GLOVE BIOGEL PI IND STRL 7.0 (GLOVE) ×2 IMPLANT
GLOVE BIOGEL PI INDICATOR 7.0 (GLOVE) ×4
KIT ACCESSORY DA VINCI DISP (KITS) ×2
KIT ACCESSORY DVNC DISP (KITS) ×1 IMPLANT
LEGGING LITHOTOMY PAIR STRL (DRAPES) ×3 IMPLANT
NEEDLE INSUFFLATION 150MM (ENDOMECHANICALS) ×3 IMPLANT
OCCLUDER COLPOPNEUMO (BALLOONS) ×3 IMPLANT
PACK ROBOT WH (CUSTOM PROCEDURE TRAY) ×3 IMPLANT
PACK ROBOTIC GOWN (GOWN DISPOSABLE) ×3 IMPLANT
PACK TRENDGUARD 450 HYBRID PRO (MISCELLANEOUS) ×1 IMPLANT
PAD PREP 24X48 CUFFED NSTRL (MISCELLANEOUS) ×3 IMPLANT
POUCH LAPAROSCOPIC INSTRUMENT (MISCELLANEOUS) ×3 IMPLANT
PROTECTOR NERVE ULNAR (MISCELLANEOUS) ×6 IMPLANT
SET CYSTO W/LG BORE CLAMP LF (SET/KITS/TRAYS/PACK) ×3 IMPLANT
SET IRRIG TUBING LAPAROSCOPIC (IRRIGATION / IRRIGATOR) ×3 IMPLANT
SET TRI-LUMEN FLTR TB AIRSEAL (TUBING) ×3 IMPLANT
SUT DVC VLOC 180 0 12IN GS21 (SUTURE) ×3
SUT VIC AB 0 CT1 27 (SUTURE) ×6
SUT VIC AB 0 CT1 27XBRD ANBCTR (SUTURE) ×2 IMPLANT
SUT VICRYL 0 UR6 27IN ABS (SUTURE) ×3 IMPLANT
SUT VICRYL RAPIDE 4/0 PS 2 (SUTURE) ×6 IMPLANT
SUT VLOC 180 0 9IN  GS21 (SUTURE)
SUT VLOC 180 0 9IN GS21 (SUTURE) IMPLANT
SUTURE DVC VLC 180 0 12IN GS21 (SUTURE) ×1 IMPLANT
SYR 50ML LL SCALE MARK (SYRINGE) ×3 IMPLANT
SYRINGE 10CC LL (SYRINGE) ×3 IMPLANT
TIP RUMI ORANGE 6.7MMX12CM (TIP) IMPLANT
TIP UTERINE 5.1X6CM LAV DISP (MISCELLANEOUS) IMPLANT
TIP UTERINE 6.7X10CM GRN DISP (MISCELLANEOUS) IMPLANT
TIP UTERINE 6.7X6CM WHT DISP (MISCELLANEOUS) ×3 IMPLANT
TIP UTERINE 6.7X8CM BLUE DISP (MISCELLANEOUS) ×3 IMPLANT
TOWEL OR 17X24 6PK STRL BLUE (TOWEL DISPOSABLE) ×9 IMPLANT
TRENDGUARD 450 HYBRID PRO PACK (MISCELLANEOUS) ×3
TROCAR DISP BLADELESS 8 DVNC (TROCAR) ×1 IMPLANT
TROCAR DISP BLADELESS 8MM (TROCAR) ×2
TROCAR PORT AIRSEAL 5X120 (TROCAR) ×3 IMPLANT
TROCAR Z-THREAD 12X150 (TROCAR) ×3 IMPLANT
WATER STERILE IRR 1000ML POUR (IV SOLUTION) ×3 IMPLANT

## 2016-01-31 NOTE — Transfer of Care (Signed)
Immediate Anesthesia Transfer of Care Note  Patient: Tammy Velez  Procedure(s) Performed: Procedure(s): ROBOTIC ASSISTED TOTAL HYSTERECTOMY WITH SALPINGECTOMY (Bilateral)  Patient Location: PACU  Anesthesia Type:General  Level of Consciousness: awake, alert  and oriented  Airway & Oxygen Therapy: Patient Spontanous Breathing and Patient connected to nasal cannula oxygen  Post-op Assessment: Report given to RN and Post -op Vital signs reviewed and stable  Post vital signs: Reviewed and stable  Last Vitals:  Vitals:   01/31/16 1118  BP: 108/73  Pulse: 85  Resp: 16  Temp: 36.7 C    Last Pain:  Vitals:   01/31/16 1118  TempSrc: Oral  PainSc: 2       Patients Stated Pain Goal: 3 ([id number])  Complications: No apparent anesthesia complications

## 2016-01-31 NOTE — Op Note (Signed)
01/31/2016  3:53 PM  PATIENT:  Tammy Velez  50 y.o. female  PRE-OPERATIVE DIAGNOSIS:  Menorrhagia Dysmenorrhea Failed Endometrial Ablation  POST-OPERATIVE DIAGNOSIS:  menorrhagia, dymenorrhea  failed endometrial ablation  PROCEDURE:  Procedure(s): ROBOTIC ASSISTED TOTAL LAPAROSCOPIC HYSTERECTOMY  BILATERAL  SALPINGECTOMY LYSIS OF SIGMOID TO LEFT ADNEXAL AND ABDOMINAL WALL ADHESIONS LYSIS OF SIGNIFICANT DENSE BLADDER ADHESIONS RIGHT OVARIAN CYSTECTOMY MCCALL CUL DE PLASTY  SURGEON:  Surgeon(s): Brien Few, MD Azucena Fallen, MD  ASSISTANTS: MODY, MD   ANESTHESIA:   local and general  ESTIMATED BLOOD LOSS: 100CC  DRAINS: Urinary Catheter (Foley)   LOCAL MEDICATIONS USED:  MARCAINE    and Amount: 20 ml  SPECIMEN:  Source of Specimen:  UTERUS, CERVIX AND BILATERAL TUBES  DISPOSITION OF SPECIMEN:  PATHOLOGY  COUNTS:  YES  DICTATION #: [id number]  PLAN OF CARE: DC HOME IN AM  PATIENT DISPOSITION:  PACU - hemodynamically stable.

## 2016-01-31 NOTE — Anesthesia Preprocedure Evaluation (Addendum)
Anesthesia Evaluation  Patient identified by MRN, date of birth, ID band Patient awake    Reviewed: Allergy & Precautions, NPO status , Patient's Chart, lab work & pertinent test results  History of Anesthesia Complications Negative for: history of anesthetic complications  Airway Mallampati: III  TM Distance: >3 FB Neck ROM: Full    Dental no notable dental hx. (+) Dental Advisory Given   Pulmonary neg shortness of breath, asthma , neg sleep apnea, neg recent URI,    Pulmonary exam normal        Cardiovascular negative cardio ROS Normal cardiovascular exam     Neuro/Psych  Headaches, neg Seizures negative psych ROS   GI/Hepatic Gall stones   Endo/Other  diabetesMorbid obesity  Renal/GU negative Renal ROS     Musculoskeletal   Abdominal   Peds  Hematology negative hematology ROS (+)   Anesthesia Other Findings   Reproductive/Obstetrics                            Anesthesia Physical  Anesthesia Plan  ASA: III  Anesthesia Plan: General   Post-op Pain Management:    Induction: Intravenous  Airway Management Planned: Oral ETT  Additional Equipment: None  Intra-op Plan:   Post-operative Plan: Extubation in OR  Informed Consent: I have reviewed the patients History and Physical, chart, labs and discussed the procedure including the risks, benefits and alternatives for the proposed anesthesia with the patient or authorized representative who has indicated his/her understanding and acceptance.   Dental advisory given  Plan Discussed with: CRNA, Surgeon and Anesthesiologist  Anesthesia Plan Comments:         Anesthesia Quick Evaluation

## 2016-01-31 NOTE — Progress Notes (Signed)
Patient seen and examined. Consent witnessed and signed. No changes noted. Update completed.Patient ID: Tammy Velez, female   DOB: [birth year], 50 y.o.   MRN: [id number]

## 2016-01-31 NOTE — Anesthesia Procedure Notes (Signed)
Procedure Name: Intubation Date/Time: 01/31/2016 1:01 PM Performed by: Tobin Chad Pre-anesthesia Checklist: Patient identified, Emergency Drugs available, Suction available and Patient being monitored Patient Re-evaluated:Patient Re-evaluated prior to inductionOxygen Delivery Method: Circle system utilized and Simple face mask Preoxygenation: Pre-oxygenation with 100% oxygen Intubation Type: Combination inhalational/ intravenous induction Ventilation: Mask ventilation without difficulty Laryngoscope Size: Mac and 3 Grade View: Grade III Tube type: Oral Tube size: 7.0 mm Number of attempts: 1 Airway Equipment and Method: Stylet Placement Confirmation: positive ETCO2 and breath sounds checked- equal and bilateral Secured at: 23 cm Tube secured with: Tape Dental Injury: Teeth and Oropharynx as per pre-operative assessment

## 2016-01-31 NOTE — Anesthesia Postprocedure Evaluation (Signed)
Anesthesia Post Note  Patient: Tammy Velez  Procedure(s) Performed: Procedure(s) (LRB): ROBOTIC ASSISTED TOTAL HYSTERECTOMY WITH SALPINGECTOMY (Bilateral)  Patient location during evaluation: PACU Anesthesia Type: General Level of consciousness: sedated Pain management: pain level controlled Vital Signs Assessment: post-procedure vital signs reviewed and stable Respiratory status: spontaneous breathing and respiratory function stable Cardiovascular status: stable Anesthetic complications: no        Last Vitals:  Vitals:   01/31/16 1700 01/31/16 1715  BP: 123/68 117/67  Pulse: 86 81  Resp: 19 (!) 21  Temp:      Last Pain:  Vitals:   01/31/16 1715  TempSrc:   PainSc: 4    Pain Goal: Patients Stated Pain Goal: 3 (01/31/16 1118)               Nik Gorrell DANIEL

## 2016-02-01 ENCOUNTER — Encounter (HOSPITAL_COMMUNITY): Payer: Self-pay | Admitting: Obstetrics and Gynecology

## 2016-02-01 DIAGNOSIS — N921 Excessive and frequent menstruation with irregular cycle: Secondary | ICD-10-CM | POA: Diagnosis not present

## 2016-02-01 LAB — CBC
HEMATOCRIT: 35.4 % — AB (ref 36.0–46.0)
HEMOGLOBIN: 11.6 g/dL — AB (ref 12.0–15.0)
MCH: 31.1 pg (ref 26.0–34.0)
MCHC: 32.8 g/dL (ref 30.0–36.0)
MCV: 94.9 fL (ref 78.0–100.0)
Platelets: 333 10*3/uL (ref 150–400)
RBC: 3.73 MIL/uL — AB (ref 3.87–5.11)
RDW: 13.3 % (ref 11.5–15.5)
WBC: 11.9 10*3/uL — ABNORMAL HIGH (ref 4.0–10.5)

## 2016-02-01 LAB — BASIC METABOLIC PANEL
ANION GAP: 8 (ref 5–15)
BUN: 11 mg/dL (ref 6–20)
CO2: 26 mmol/L (ref 22–32)
Calcium: 7.6 mg/dL — ABNORMAL LOW (ref 8.9–10.3)
Chloride: 103 mmol/L (ref 101–111)
Creatinine, Ser: 0.82 mg/dL (ref 0.44–1.00)
GFR calc non Af Amer: >60 mL/min (ref >60)
GLUCOSE: 133 mg/dL — AB (ref 65–99)
Potassium: 4.1 mmol/L (ref 3.5–5.1)
Sodium: 137 mmol/L (ref 135–145)

## 2016-02-01 MED ORDER — INFLUENZA VAC SPLIT QUAD 0.5 ML IM SUSY
0.5000 mL | PREFILLED_SYRINGE | INTRAMUSCULAR | Status: AC
  Administered 2016-02-01: 0.5 mL via INTRAMUSCULAR
  Filled 2016-02-01: qty 0.5

## 2016-02-01 MED ORDER — INFLUENZA VAC SPLIT QUAD 0.5 ML IM SUSY: 0.5000 mL | PREFILLED_SYRINGE | INTRAMUSCULAR | Status: DC

## 2016-02-01 MED ORDER — OXYCODONE-ACETAMINOPHEN 5-325 MG PO TABS: 1.0000 | ORAL_TABLET | ORAL | 0 refills | Status: DC | PRN

## 2016-02-01 MED ORDER — TRAMADOL HCL 50 MG PO TABS: 50.0000 mg | ORAL_TABLET | Freq: Four times a day (QID) | ORAL | 0 refills | Status: DC | PRN

## 2016-02-01 NOTE — Op Note (Signed)
NAME:  Tammy Velez, Tammy Velez                  ACCOUNT NO.:  MEDICAL RECORD NO.:  [id number]  LOCATION:                                 FACILITY:  PHYSICIAN:  Lovenia Kim, M.D.     DATE OF BIRTH:  DATE OF PROCEDURE: DATE OF DISCHARGE:                              OPERATIVE REPORT   PREOPERATIVE DIAGNOSIS:  Refractory menometrorrhagia with failed endometrial ablation.  POSTOPERATIVE DIAGNOSES:  Refractory menometrorrhagia with failed endometrial ablation plus pelvic adhesions of left sigmoid colon to left adnexa and anterior abdominal wall, right ovarian cyst, enterocele.  PROCEDURE:  [employer] total laparoscopic hysterectomy, bilateral salpingectomy, lysis of sigmoid to left adnexal and abdominal wall adhesions, lysis of dense bladder adhesions to the mid uterus and lower uterine segment, McCall culdoplasty, right ovarian cystectomy.  SURGEON:  Lovenia Kim, M.D.  ASSISTANTBenjie Karvonen.  ANESTHESIA:  General and local.  ESTIMATED BLOOD LOSS:  Approximately 100 mL.  COMPLICATIONS:  None.  DRAINS:  Foley.  COUNTS:  Correct.  DISPOSITION:  The patient to recovery in good condition.  BRIEF OPERATIVE NOTE:  After being apprised of risks of anesthesia, infection, bleeding, injury to surrounding organs, possible need for repair, delayed versus immediate complications to include bowel and bladder injury, possible need for repair, the patient was brought to the operating room.  She was administered general anesthetic without complications, prepped and draped in usual sterile fashion.  Foley catheter placed.  Exam under anesthesia revealing an acutely anteflexed uterus.  No adnexal masses.  A RUMI retractor placed vaginally. Infraumbilical incision was made with a scalpel.  Veress needle was placed.  Opening pressure of 3 noted and 3 L CO2 insufflated without difficulty.  Trocar placed atraumatically.  Visualization reveals as noted.  Dense adhesions of the left adnexa to the  left sigmoid mesentery in addition to mesenteric and bowel adhesions to the anterior abdominal wall on the left.  Dense adhesions of the bladder in the anterior with obliteration of the anterior cul-de-sac and approximately 2-3 cm right ovarian cyst.  Two robotic ports were placed on the right, 1 robotic port and the AirSeal port placed on the left atraumatically.  Deep Trendelenburg position was established.  The robot was docked in standard fashion with placement of ProGrasp, PK forceps, and Endo Shears.  At this time, the dense adhesions on the left anterior abdominal wall and left adnexa are lysed sharply with dissection with the Endo Shears freeing the left adnexa.  The adhesions of the left mesosalpinx were then undermined using monopolar cautery and separated along the mesosalpinx down to the level of the uterus.  Retroperitoneal space was entered on the left and the left ovarian ligament was divided. Space is better developed.  Adhesions freed.  There are dense adhesions to the round ligament to the anterior abdominal wall, which are also lysed and the left uterine vessels are exposed, but not skeletonized due to dense bladder flap adhesions at this time.  On the right side, the right ovarian cyst is excised, found to be simple, fluid-filled.  The right mesosalpinx is undermined and divided using monopolar cautery. The right ovarian ligament is divided.  Retroperitoneal space entered.  Ureters appear markedly deep to the level of dissection.  The right round ligament is also adhesed to the anterior abdominal wall.  This is skeletonized and divided.  At this time, the bladder is filled retrograde with Indigo carmine stained fluid, 200 mL retrofilled to delineate the bladder base.  Sharp dissection of the dense bladder adhesions is done in a left-to-right fashion until the RUMI cup is identified bulging along the lower segment.  This dissection is done extensively and clearly with no  evidence of any extravasation of blue dye during the process.  After development of the bladder flap and sharp dissection, the uterine vessels on the right and left were dissected, cauterized, and cut down to the level of the RUMI cup, which was then circumscribed and cut at the cervicovaginal junction 360 degrees. Specimen retracted in the vagina without difficulty.  The cuff is closed using a 0 V-Loc suture in continuous running fashion.  Second imbricating layer placed.  McCall culdoplasty suture placed.  Irrigation accomplished.  Urine appears clear to blue stained as noted.  No evidence of blue dye in the belly.  Both ovaries appeared normal.  At this time, all ports were removed under direct visualization.  CO2 was released.  Incisions were closed with a 0 Vicryl and 4-0 Vicryl. Vaginal exam reveals the cuff to be well approximated and well supported.  At this time, procedure was terminated.  The patient tolerated the procedure well, was awakened and transferred to recovery in good condition.     Lovenia Kim, M.D.     RJT/MEDQ  D:  01/31/2016  T:  01/31/2016  Job:  [id number]

## 2016-02-01 NOTE — Addendum Note (Signed)
Addendum  created 02/01/16 [id number] by Hewitt Blade, CRNA   Sign clinical note

## 2016-02-01 NOTE — Progress Notes (Signed)
Patient discharged home with family. Discharge paperwork and instructions reviewed with patient. No questions at time of discharge.

## 2016-02-01 NOTE — Progress Notes (Signed)
1 Day Post-Op Procedure(s) (LRB): ROBOTIC ASSISTED TOTAL HYSTERECTOMY WITH SALPINGECTOMY (Bilateral)  Subjective: Patient reports nausea, incisional pain, tolerating PO, + flatus and no problems voiding.   No SOB .  Objective: BP 129/71 (BP Location: Right Arm)   Pulse 82   Temp 98.5 F (36.9 C) (Oral)   Resp 16   Ht 5\' 4"  (1.626 m)   Wt 118.4 kg (261 lb)   LMP 01/24/2016 (Exact Date)   SpO2 100%   BMI 44.80 kg/m   I have reviewed patient's vital signs, intake and output, medications and labs. CBC    Component Value Date/Time   WBC 11.9 (H) 02/01/2016 0517   RBC 3.73 (L) 02/01/2016 0517   HGB 11.6 (L) 02/01/2016 0517   HCT 35.4 (L) 02/01/2016 0517   PLT 333 02/01/2016 0517   MCV 94.9 02/01/2016 0517   MCH 31.1 02/01/2016 0517   MCHC 32.8 02/01/2016 0517   RDW 13.3 02/01/2016 0517   LYMPHSABS 2.7 10/03/2014 1442   MONOABS 0.7 10/03/2014 1442   EOSABS 0.2 10/03/2014 1442   BASOSABS 0.0 10/03/2014 1442     General: alert, cooperative and appears stated age Resp: clear to auscultation bilaterally and normal percussion bilaterally Cardio: regular rate and rhythm, S1, S2 normal, no murmur, click, rub or gallop GI: soft, non-tender; bowel sounds normal; no masses,  no organomegaly and incision: clean, dry and intact Extremities: extremities normal, atraumatic, no cyanosis or edema and Homans sign is negative, no sign of DVT Vaginal Bleeding: minimal  Assessment: s/p Procedure(s): ROBOTIC ASSISTED TOTAL HYSTERECTOMY WITH SALPINGECTOMY (Bilateral): stable, progressing well and tolerating diet  Plan: Ambulate DC PCA DC home today FU office 2 weeks.  LOS: 0 days    Jaylianna Tatlock J 02/01/2016, 6:14 AM

## 2016-02-01 NOTE — Anesthesia Postprocedure Evaluation (Signed)
Anesthesia Post Note  Patient: Tammy Velez  Procedure(s) Performed: Procedure(s) (LRB): ROBOTIC ASSISTED TOTAL HYSTERECTOMY WITH SALPINGECTOMY (Bilateral)  Patient location during evaluation: [facility] Anesthesia Type: General Level of consciousness: awake and alert Pain management: pain level controlled Vital Signs Assessment: post-procedure vital signs reviewed and stable Respiratory status: spontaneous breathing and nonlabored ventilation Cardiovascular status: stable Postop Assessment: adequate PO intake and no signs of nausea or vomiting Anesthetic complications: no        Last Vitals:  Vitals:   02/01/16 0400 02/01/16 0528  BP: 116/68   Pulse: 66   Resp: 18 18  Temp: 36.8 C     Last Pain:  Vitals:   02/01/16 0735  TempSrc:   PainSc: 2    Pain Goal: Patients Stated Pain Goal: 3 (02/01/16 0735)               Jabier Mutton

## 2016-07-10 ENCOUNTER — Telehealth: Payer: Self-pay | Admitting: Internal Medicine

## 2016-07-11 NOTE — Telephone Encounter (Signed)
Spoke with pt. States that she would like to re-establish with MR for cough. Advised her that she would be considered a new pt since we have not seen her since 2013. She verbalized understanding. Pt has been scheduled to see MR on 09/25/16 at 9:30am. In the meantime, she will contact her PCP for and appointment for her cough. Nothing further was needed.

## 2016-09-25 ENCOUNTER — Ambulatory Visit (INDEPENDENT_AMBULATORY_CARE_PROVIDER_SITE_OTHER): Payer: Managed Care, Other (non HMO) | Admitting: Internal Medicine

## 2016-09-25 ENCOUNTER — Encounter: Payer: Self-pay | Admitting: Internal Medicine

## 2016-09-25 VITALS — BP 122/80 | HR 84 | Ht 64.0 in | Wt 253.0 lb

## 2016-09-25 DIAGNOSIS — J387 Other diseases of larynx: Secondary | ICD-10-CM | POA: Diagnosis not present

## 2016-09-25 DIAGNOSIS — R05 Cough: Secondary | ICD-10-CM

## 2016-09-25 DIAGNOSIS — R053 Chronic cough: Secondary | ICD-10-CM

## 2016-09-25 LAB — NITRIC OXIDE: NITRIC OXIDE: 29

## 2016-09-25 MED ORDER — BUDESONIDE-FORMOTEROL FUMARATE 80-4.5 MCG/ACT IN AERO
2.0000 | INHALATION_SPRAY | Freq: Two times a day (BID) | RESPIRATORY_TRACT | 12 refills | Status: DC
Start: 2016-09-25 — End: 2020-05-08

## 2016-09-25 MED ORDER — ALBUTEROL SULFATE HFA 108 (90 BASE) MCG/ACT IN AERS: 2.0000 | INHALATION_SPRAY | Freq: Four times a day (QID) | RESPIRATORY_TRACT | 6 refills | Status: DC | PRN

## 2016-09-25 NOTE — Patient Instructions (Signed)
ICD-10-CM   1. Chronic cough R05   2. Irritable larynx syndrome J38.7     Stop advair Start symbicort 80/4.5 2 puff twice daily scheduled Alubterol as needed  followup 8 weeks teo report progress If symptoms persist or worse or only partially better, then imaging and other workup

## 2016-09-25 NOTE — Addendum Note (Signed)
Addended by: Collier Salina on: 09/25/2016 12:19 PM   Modules accepted: Orders

## 2016-09-25 NOTE — Progress Notes (Addendum)
Subjective:     Patient ID: Tammy Velez, female   DOB: 1966/01/06, 51 y.o.   MRN: [id number]  HPI   IOV 09/11/10: This is 2 months fu afer her first visit when multimodal Rx was started including our cyclical cough protocol. STates that voice rest helped cough 50% but relapsed 2 weeks later so she followed with another voice rest that again helped 50% for another week or so but now coughing back at baseline. Wakes up at night due to cough. Dry cough.  Severity is moderate but occasionally severe. She notices talking at work in [place] makes cough worse. Voice rest clearly helps. IN addition, zegerid has helped partially esp with heartburn and stomach issues. Also notices that if and when she follows gerd diet it can help somewhat but she is largely non compliant with anti gerd diet. Uses qnasal all along but finally ran out last week; does not think this helped.   [place]fman Cough Reflux Index Score is 31 and fits in with LPR cough (hoarse voice, clearing throat, post nasal drip, cough worse lyng down, lump in throat, annoying couh and heartburn)   OV 11/13/10: cough score improved to 9.5. Methacholine challenge ordered  OV 11/25/10: Fu after methacholine challenge test: TEST done this month is strongly poisitive for asthma. Brought on all symptoms. No new symptomatology. RSI cough score is 16. She is wondering if moving to [place] made her develop asthma.   REC  -Cough is from sinus drainage, acid reflux and ASTHMA as seen on methacholine challenge test #Sinus drainage  - continue  netti pot as much as poosible for sinus - I know you do not like it #Acid Reflux  - definitely causative reason for cough  - continue zegerid 20mg  capsule daily on empty stomach   -- avoid colas, spices, cheeses, spirits, red meats, beer, chocolates, fried foods etc.,   - sleep with head end of bed elevated  - eat small frequent meals  - do not go to bed for 3 hours after last meal - follow the diet  sheet # Asthma   - START QVAR 2 puff twice daily - take sample, show technique - Use albuterol 2 puff as needed; show tecqnique #Followup -2 months or sooner if neede  03/10/2011 Acute OV  Complains of productive cough with yellow mucus, wheezing, increased SOB, low grade temp x 5 days. Doing well until last few days. OTC not working. Cough is getting worse. Keeping her up at night. No hemoptysis or fever. No chest pain or edema.  No new meds or travel.    REC Zpack take as directed.  Mucinex DM Twice daily As needed  Fluids and rest  Hydromet 1-2 tsp every 4 hr As needed Cough- may make you sleepy.  Please contact office for sooner follow up if symptoms do not improve or worsen or seek emergency care  follow up Dr. Chase Caller in 6-8 weeks and As needed    OV 04/23/2011 Followup chronic cough on basis of methacholine proven asthma, GERD and LPR  She saw NP 6 weeks ago for acute bronchitis symptoms and treated with zpak. No prednisone Rx. Since then cough not better. Coughs all the time. Feels chest is congested and tight and she needs to take a deep breath. GAgs +. Feels onset of pollen season is resulting in dry eyes, sneezing and runny nose. Post nasal drip, clearing of throat, and scratchy throat +. Denies fever, wheeze, edema. RSI cough score is worse and  33 and c/w  LPR cough (Level 5 - post nasal drip, annoying cough. Level 4 - clearing of throat, sensation of something in trhoat. Level 3 - hoarseness of voice, choking episodes, and heartburn. LEvel 2 - difficulty swallowing foods)  Past, Family, Social reviewed: job promotion as Engineer, building services at [employer]. Needs to talk a lot.   Current outpatient prescriptions:beclomethasone (QVAR) 80 MCG/ACT inhaler, Inhale 1 puff into the lungs as needed., Disp: 1 Inhaler, Rfl: 12;  omeprazole-sodium bicarbonate (ZEGERID) 40-1100 MG per capsule, Take 1 capsule by mouth daily before breakfast.  , Disp: , Rfl:    REC -Cough is from sinus  drainage, acid reflux and ASTHMA  - The sinus and asthma appear to be acting up ; we call this exacerbation  #Sinus drainage  - take generic fluticasone inhaler 2 squirts each nostril daily; take script  - if this is not enough add nightly chlorpheniramine 4-8mg  ; can make you very dry  #Acid Reflux  - continue zegerid 20mg  capsule daily on empty stomach  -- avoid colas, spices, cheeses, spirits, red meats, beer, chocolates, fried foods etc.,  - sleep with head end of bed elevated  - eat small frequent meals  - do not go to bed for 3 hours after last meal  - follow the diet sheet  # Asthma  - Please take Take prednisone 40mg  once daily x 3 days, then 30mg  once daily x 3 days, then 20mg  once daily x 3 days, then prednisone 10mg  once daily x 3 days and stop  - stop qvar  - STart Advair HFA 115/21 2 puff twice daily, - take sample, show technique. Rinse mouth after use  - Use albuterol 2 puff as needed; show tecqnique  #Followup  -4-6 weeks or sooner if needed  - at followup do cough score   OV 01/22/2012   - Not followed up for chronic cough becaues it was almost gone/hardly present. She was not doing flonase or ppi. She has continued advair. She was doing real well till 12/16 Monday when she picked up a low grade fever. Then on 01/20/12 lot of nasal congeestion followed by severe cough, chest tightness and wheeze when she lies down. Cough is severe and is associated with insomnia. Cough made worse by supine. Cough associated with lot of nasal drainage, fatigue, myalgia but no more fever. Cough improved by trials of OTC meds like dayquil, nyquil, sudafed  - RSI cough score is 32   OV 09/25/2016 - NEW OFFICE VISIT - > 3 years since prior   Chief Complaint  Patient presents with  . Pulm Consult    Has a history of asthma. Last seen in 2013. States her current inhalers are not working well for her.      51 year old female personally not seen in 5 years. Therefore the new  consultation. I saw her for chronic cough with irritable larynx syndrome of Neurontin as stated with methacholine challenge test positive asthma. She had been on Advair and then she stopped following. Now she is back because her symptoms of back and she feels that the Advair is no longer controlling the symptoms.  In talking to her she tells me that for the last 5 years and up until 9 months ago she was doing really well other than an extremely rare albuterol usage and twice a year prescriptions of classic asthma exacerbation symptoms that would require nebulizer steroid and antibiotics at the doctor's office on an urgent basis. Then starting approximately 9  months ago she started feeling that there was no longer controlling her. She's not fully cleared terms of the nature of her symptoms but it seems like she is having some nocturnal wheezing and random wheezing and also feeling that she has to take a deep breath. In the sense that the choir she feels her throat is very GRAVELLY. Then approximately 2-3 months ago she started exercising in a small group class and she feels she is extremely behind and even at submaximal exercise she is very short of breath and she does not have the exercise capacity that her  Peers do. RSI cough score 29 ppb which is on Advair and December the gray zone but towards normal end. RSI cough socre 28   Dr Lorenza Cambridge Reflux Symptom Index (> 13-15 suggestive of LPR cough) 2013 dec 01/22/2012  09/25/2016   Hoarseness of problem with voice 4 4  Clearing  Of Throat 4 3  Excess throat mucus or feeling of post nasal drip 4 3  Difficulty swallowing food, liquid or tablets 2 0  Cough after eating or lying down 5 3  Breathing difficulties or choking episodes 3 3  Troublesome or annoying cough 5 4  Sensation of something sticking in throat or lump in throat 4 4  Heartburn, chest pain, indigestion, or stomach acid coming up 1 4  TOTAL 32 28    [place] 09/25/2016 0 29ppb    has a past  medical history of Asthma; Complication of anesthesia (11/05/11); Constipation; Eczema; Headache; History of kidney stones; Insulin-resistant diabetes mellitus and acanthosis nigricans; Left ureteral calculus; Nausea & vomiting; and Upper respiratory infection.   reports that she has never smoked. She has never used smokeless tobacco.  Past Surgical History:  Procedure Laterality Date  . BACK SURGERY  02/02/14   L5- S1 discectomy  . CESAREAN SECTION  2002 &  2005   x2  . CHOLECYSTECTOMY N/A 10/06/2014   Procedure: LAPAROSCOPIC CHOLECYSTECTOMY WITH INTRAOPERATIVE CHOLANGIOGRAM;  Surgeon: Autumn Messing III, MD;  Location: [facility];  Service: General;  Laterality: N/A;  . CYSTO/ RIGHT RETROGRADE PYELOGRAM/ RIGHT URETERAL STENT PLACEMENT  10-28-2010   RIGHT RENAL STONE  . CYSTOSCOPY WITH URETEROSCOPY  11/05/2011   Procedure: CYSTOSCOPY WITH URETEROSCOPY;  Surgeon: Bernestine Amass, MD;  Location[facility];  Service: Urology;  Laterality: Left;  URETEROSCOPY , STONE EXTRACTION POSSIBLE BASKET RETROGRADE AND HOLMIUM LASER AND POSSIBLE STENT   C ARM  LASER   . DILITATION & CURRETTAGE/HYSTROSCOPY WITH NOVASURE ABLATION N/A 09/09/2013   Procedure: DILATATION & CURETTAGE/HYSTEROSCOPY WITH NOVASURE ABLATION;  Surgeon: Lovenia Kim, MD;  Location: [facility];  Service: Gynecology;  Laterality: N/A;  . ROBOTIC ASSISTED TOTAL HYSTERECTOMY WITH SALPINGECTOMY Bilateral 01/31/2016   Procedure: ROBOTIC ASSISTED TOTAL HYSTERECTOMY WITH SALPINGECTOMY;  Surgeon: Brien Few, MD;  Location: [facility];  Service: Gynecology;  Laterality: Bilateral;    No Known Allergies  Immunization History  Administered Date(s) Administered  . Influenza Split 11/13/2010  . Influenza,inj,Quad PF,6+ Mos 02/01/2016    Family History  Problem Relation Age of Onset  . Heart disease Mother        a-fib and MVP     Current Outpatient Prescriptions:  .  albuterol (PROVENTIL HFA;VENTOLIN HFA) 108 (90 BASE) MCG/ACT  inhaler, Inhale 1-2 puffs into the lungs every 6 (six) hours as needed for wheezing or shortness of breath. , Disp: , Rfl:  .  atorvastatin (LIPITOR) 20 MG tablet, Take 20 mg by mouth at bedtime., Disp: ,  Rfl:  .  Fluticasone-Salmeterol (ADVAIR) 250-50 MCG/DOSE AEPB, Inhale 1 puff into the lungs 2 (two) times daily. , Disp: , Rfl:     Review of Systems     Objective:   Physical Exam  Constitutional: She is oriented to person, place, and time. She appears well-developed and well-nourished. No distress.  HENT:  Head: Normocephalic and atraumatic.  Right Ear: External ear normal.  Left Ear: External ear normal.  Mouth/Throat: Oropharynx is clear and moist. No oropharyngeal exudate.  Eyes: Pupils are equal, round, and reactive to light. Conjunctivae and EOM are normal. Right eye exhibits no discharge. Left eye exhibits no discharge. No scleral icterus.  Neck: Normal range of motion. Neck supple. No JVD present. No tracheal deviation present. No thyromegaly present.  Cardiovascular: Normal rate, regular rhythm, normal heart sounds and intact distal pulses.  Exam reveals no gallop and no friction rub.   No murmur heard. Pulmonary/Chest: Effort normal and breath sounds normal. No respiratory distress. She has no wheezes. She has no rales. She exhibits no tenderness.  Abdominal: Soft. Bowel sounds are normal. She exhibits no distension and no mass. There is no tenderness. There is no rebound and no guarding.  Musculoskeletal: Normal range of motion. She exhibits no edema or tenderness.  Lymphadenopathy:    She has no cervical adenopathy.  Neurological: She is alert and oriented to person, place, and time. She has normal reflexes. No cranial nerve deficit. She exhibits normal muscle tone. Coordination normal.  Skin: Skin is warm and dry. No rash noted. She is not diaphoretic. No erythema. No pallor.  Psychiatric: She has a normal mood and affect. Her behavior is normal. Judgment and thought  content normal.  Vitals reviewed.   Vitals:   09/25/16 0936  BP: 122/80  Pulse: 84  SpO2: 96%  Weight: 253 lb (114.8 kg)  Height: 5\' 4"  (1.626 m)     Estimated body mass index is 43.43 kg/m as calculated from the following:   Height as of this encounter: 5\' 4"  (1.626 m).   Weight as of this encounter: 253 lb (114.8 kg).     Assessment:       ICD-10-CM   1. Chronic cough R05   2. Irritable larynx syndrome J38.7         Plan:      I think exercise related dyspnea could all be deconditioning and diastolic dysfunction associated with obesity. However a lot of the other symptoms she is describing could be asthma versus cough neuropathy/irritable larynx syndrome. Given the near normal exhaled nitric oxide I would suspect this is all irritable larynx syndrome. She is on Advair which is a dry powder inhaler. We discussed that as a starting step, can cut down on throat irritation with stopping Advair and started Symbicort. Clearly this strategy does not work then we will consider imaging and possibly pulmonary stress testing. She is agreeable with this plan she is agreed to be active follow-ups a ctive with followup   Dr. Brand Males, M.D., [facility].C.P Pulmonary and Critical Care Medicine Staff Physician [facility] Pager: [phone], If no answer or between  15:00h - 7:00h: call [phone]  09/25/2016 10:23 AM

## 2016-09-25 NOTE — Progress Notes (Signed)
   Subjective:    Patient ID: Tammy Velez, female    DOB: 07/30/1965, 51 y.o.   MRN: [id number]  HPI    Review of Systems  Constitutional: Negative for fever and unexpected weight change.  HENT: Negative for congestion, dental problem, ear pain, nosebleeds, postnasal drip, rhinorrhea, sinus pressure, sneezing, sore throat and trouble swallowing.   Eyes: Negative for redness and itching.  Respiratory: Positive for cough and shortness of breath. Negative for chest tightness and wheezing.   Cardiovascular: Negative for palpitations and leg swelling.  Gastrointestinal: Negative for nausea and vomiting.  Genitourinary: Negative for dysuria.  Musculoskeletal: Negative for joint swelling.  Skin: Negative for rash.  Neurological: Negative for headaches.  Hematological: Does not bruise/bleed easily.  Psychiatric/Behavioral: Negative for dysphoric mood. The patient is not nervous/anxious.        Objective:   Physical Exam        Assessment & Plan:

## 2016-09-25 NOTE — Addendum Note (Signed)
Addended by: Collier Salina on: 09/25/2016 12:28 PM   Modules accepted: Orders

## 2016-12-04 ENCOUNTER — Other Ambulatory Visit: Payer: Managed Care, Other (non HMO)

## 2016-12-04 ENCOUNTER — Ambulatory Visit (INDEPENDENT_AMBULATORY_CARE_PROVIDER_SITE_OTHER): Payer: Managed Care, Other (non HMO) | Admitting: Internal Medicine

## 2016-12-04 ENCOUNTER — Encounter: Payer: Self-pay | Admitting: Internal Medicine

## 2016-12-04 VITALS — BP 126/90 | HR 87 | Ht 64.0 in | Wt 251.2 lb

## 2016-12-04 DIAGNOSIS — Z23 Encounter for immunization: Secondary | ICD-10-CM

## 2016-12-04 DIAGNOSIS — R05 Cough: Secondary | ICD-10-CM

## 2016-12-04 DIAGNOSIS — R053 Chronic cough: Secondary | ICD-10-CM

## 2016-12-04 DIAGNOSIS — J387 Other diseases of larynx: Secondary | ICD-10-CM

## 2016-12-04 DIAGNOSIS — R059 Cough, unspecified: Secondary | ICD-10-CM

## 2016-12-04 LAB — NITRIC OXIDE: Nitric Oxide: 19

## 2016-12-04 NOTE — Addendum Note (Signed)
Addended by: Lorretta Harp on: 12/04/2016 02:06 PM   Modules accepted: Orders

## 2016-12-04 NOTE — Patient Instructions (Addendum)
ICD-10-CM   1. Chronic cough R05   2. Irritable larynx syndrome J38.7     Doubt you have asthma I tihnk this is more and more like chronic refractory cough (CRC) or irritable larynx or cough neuropathy Too bad neurontin did not work for you in past  Plan - flu shot 12/04/2016  - for now continue symbicort and nsal steroid   - do CT sinus without contrast, do HRCT and do blood allergy panel and IgE  - ok to do this in Jan 2019 - if these blood work is normal will refer  To Ernestine Conrad MD  ENT in Jan 2019  Followup  - based on blood work an CT

## 2016-12-04 NOTE — Progress Notes (Signed)
Subjective:     Patient ID: Tammy Velez, female   DOB: [birth year], 51 y.o.   MRN: [id number]  HPI  IOV 09/11/10: This is 2 months fu afer her first visit when multimodal Rx was started including our cyclical cough protocol. STates that voice rest helped cough 50% but relapsed 2 weeks later so she followed with another voice rest that again helped 50% for another week or so but now coughing back at baseline. Wakes up at night due to cough. Dry cough.  Severity is moderate but occasionally severe. She notices talking at work in [place] makes cough worse. Voice rest clearly helps. IN addition, zegerid has helped partially esp with heartburn and stomach issues. Also notices that if and when she follows gerd diet it can help somewhat but she is largely non compliant with anti gerd diet. Uses qnasal all along but finally ran out last week; does not think this helped.   [place] Cough Reflux Index Score is 31 and fits in with LPR cough (hoarse voice, clearing throat, post nasal drip, cough worse lyng down, lump in throat, annoying couh and heartburn)   OV 11/13/10: cough score improved to 9.5. Methacholine challenge ordered  OV 11/25/10: Fu after methacholine challenge test: TEST done this month is strongly poisitive for asthma. Brought on all symptoms. No new symptomatology. RSI cough score is 16. She is wondering if moving to [place] made her develop asthma.   REC  -Cough is from sinus drainage, acid reflux and ASTHMA as seen on methacholine challenge test #Sinus drainage  - continue  netti pot as much as poosible for sinus - I know you do not like it #Acid Reflux  - definitely causative reason for cough  - continue zegerid 20mg  capsule daily on empty stomach   -- avoid colas, spices, cheeses, spirits, red meats, beer, chocolates, fried foods etc.,   - sleep with head end of bed elevated  - eat small frequent meals  - do not go to bed for 3 hours after last meal - follow the diet sheet #  Asthma   - START QVAR 2 puff twice daily - take sample, show technique - Use albuterol 2 puff as needed; show tecqnique #Followup -2 months or sooner if neede  03/10/2011 Acute OV  Complains of productive cough with yellow mucus, wheezing, increased SOB, low grade temp x 5 days. Doing well until last few days. OTC not working. Cough is getting worse. Keeping her up at night. No hemoptysis or fever. No chest pain or edema.  No new meds or travel.    REC Zpack take as directed.  Mucinex DM Twice daily As needed  Fluids and rest  Hydromet 1-2 tsp every 4 hr As needed Cough- may make you sleepy.  Please contact office for sooner follow up if symptoms do not improve or worsen or seek emergency care  follow up Dr. Chase Caller in 6-8 weeks and As needed    OV 04/23/2011 Followup chronic cough on basis of methacholine proven asthma, GERD and LPR  She saw NP 6 weeks ago for acute bronchitis symptoms and treated with zpak. No prednisone Rx. Since then cough not better. Coughs all the time. Feels chest is congested and tight and she needs to take a deep breath. GAgs +. Feels onset of pollen season is resulting in dry eyes, sneezing and runny nose. Post nasal drip, clearing of throat, and scratchy throat +. Denies fever, wheeze, edema. RSI cough score is worse and 33  and c/w  LPR cough (Level 5 - post nasal drip, annoying cough. Level 4 - clearing of throat, sensation of something in trhoat. Level 3 - hoarseness of voice, choking episodes, and heartburn. LEvel 2 - difficulty swallowing foods)  Past, Family, Social reviewed: job promotion as Engineer, building services at [employer]. Needs to talk a lot.   Current outpatient prescriptions:beclomethasone (QVAR) 80 MCG/ACT inhaler, Inhale 1 puff into the lungs as needed., Disp: 1 Inhaler, Rfl: 12;  omeprazole-sodium bicarbonate (ZEGERID) 40-1100 MG per capsule, Take 1 capsule by mouth daily before breakfast.  , Disp: , Rfl:    REC -Cough is from sinus drainage, acid  reflux and ASTHMA  - The sinus and asthma appear to be acting up ; we call this exacerbation  #Sinus drainage  - take generic fluticasone inhaler 2 squirts each nostril daily; take script  - if this is not enough add nightly chlorpheniramine 4-8mg  ; can make you very dry  #Acid Reflux  - continue zegerid 20mg  capsule daily on empty stomach  -- avoid colas, spices, cheeses, spirits, red meats, beer, chocolates, fried foods etc.,  - sleep with head end of bed elevated  - eat small frequent meals  - do not go to bed for 3 hours after last meal  - follow the diet sheet  # Asthma  - Please take Take prednisone 40mg  once daily x 3 days, then 30mg  once daily x 3 days, then 20mg  once daily x 3 days, then prednisone 10mg  once daily x 3 days and stop  - stop qvar  - STart Advair HFA 115/21 2 puff twice daily, - take sample, show technique. Rinse mouth after use  - Use albuterol 2 puff as needed; show tecqnique  #Followup  -4-6 weeks or sooner if needed  - at followup do cough score   OV 01/22/2012   - Not followed up for chronic cough becaues it was almost gone/hardly present. She was not doing flonase or ppi. She has continued advair. She was doing real well till 12/16 Monday when she picked up a low grade fever. Then on 01/20/12 lot of nasal congeestion followed by severe cough, chest tightness and wheeze when she lies down. Cough is severe and is associated with insomnia. Cough made worse by supine. Cough associated with lot of nasal drainage, fatigue, myalgia but no more fever. Cough improved by trials of OTC meds like dayquil, nyquil, sudafed  - RSI cough score is 32   OV 09/25/2016 - NEW OFFICE VISIT - > 3 years since prior   Chief Complaint  Patient presents with  . Pulm Consult    Has a history of asthma. Last seen in 2013. States her current inhalers are not working well for her.      51 year old female personally not seen in 5 years. Therefore the new consultation. I saw her for  chronic cough with irritable larynx syndrome of Neurontin as stated with methacholine challenge test positive asthma. She had been on Advair and then she stopped following. Now she is back because her symptoms of back and she feels that the Advair is no longer controlling the symptoms.  In talking to her she tells me that for the last 5 years and up until 9 months ago she was doing really well other than an extremely rare albuterol usage and twice a year prescriptions of classic asthma exacerbation symptoms that would require nebulizer steroid and antibiotics at the doctor's office on an urgent basis. Then starting approximately 9 months  ago she started feeling that there was no longer controlling her. She's not fully cleared terms of the nature of her symptoms but it seems like she is having some nocturnal wheezing and random wheezing and also feeling that she has to take a deep breath. In the sense that the choir she feels her throat is very "GRAVELLY". Then approximately 2-3 months ago she started exercising in a small group class and she feels she is extremely behind and even at submaximal exercise she is very short of breath and she does not have the exercise capacity that her  Peers do. RSI cough score 29 ppb which is on Advair and December the gray zone but towards normal end. RSI cough socre 28    OV 12/04/2016  Chief Complaint  Patient presents with  . Follow-up    Pt states that she had a cold x2 months ago and is still coughing from that. Occ coughing up yellow to brown mucus and has occ. SOB. Denies any CP.  follow-up chronic coughsuspect irritable larynx syndrome/cough neuropathy  Last visit put her on inhaled corticosteroid Symbicort because exhaled nitric oxide was water line and previous methacholine challenge test many years ago is positive. She believes that the Symbicort did help her with the effect wears off by end of the day However objective testing RSI cough score is basically  unchanged in the last 5 years. She then tells me that normal stimuli like change in weather, some fumescan make her cough a lot. She constantly clears her throat. Review of the chart shows that she's not had CT sinus and CT chest. She's willing to have this and allergy blood test but wants to wait till January 2019 because she is on a high deductible health plan.   Dr Lorenza Cambridge Reflux Symptom Index (> 13-15 suggestive of LPR cough) 2013 dec 01/22/2012  09/25/2016 feno 29ppb Off ICS (prior MCT positive) 12/04/2016 feno 19ppb On symbicort  Hoarseness of problem with voice 4 4 3   Clearing  Of Throat 4 3 4   Excess throat mucus or feeling of post nasal drip 4 3 4   Difficulty swallowing food, liquid or tablets 2 0 3  Cough after eating or lying down 5 3 3   Breathing difficulties or choking episodes 3 3 2   Troublesome or annoying cough 5 4 4   Sensation of something sticking in throat or lump in throat 4 4 4   Heartburn, chest pain, indigestion, or stomach acid coming up 1 4 3   TOTAL 32 28 30      has a past medical history of Asthma; Complication of anesthesia (11/05/11); Constipation; Eczema; Headache; History of kidney stones; Insulin-resistant diabetes mellitus and acanthosis nigricans; Left ureteral calculus; Nausea & vomiting; and Upper respiratory infection.   reports that she has never smoked. She has never used smokeless tobacco.  Past Surgical History:  Procedure Laterality Date  . BACK SURGERY  02/02/14   L5- S1 discectomy  . CESAREAN SECTION  2002 &  2005   x2  . CHOLECYSTECTOMY N/A 10/06/2014   Procedure: LAPAROSCOPIC CHOLECYSTECTOMY WITH INTRAOPERATIVE CHOLANGIOGRAM;  Surgeon: Autumn Messing III, MD;  Location: [facility];  Service: General;  Laterality: N/A;  . CYSTO/ RIGHT RETROGRADE PYELOGRAM/ RIGHT URETERAL STENT PLACEMENT  10-28-2010   RIGHT RENAL STONE  . CYSTOSCOPY WITH URETEROSCOPY  11/05/2011   Procedure: CYSTOSCOPY WITH URETEROSCOPY;  Surgeon: Bernestine Amass, MD;  Location[facility];  Service: Urology;  Laterality: Left;  URETEROSCOPY , STONE EXTRACTION POSSIBLE BASKET  RETROGRADE AND HOLMIUM LASER AND POSSIBLE STENT   C ARM  LASER   . DILITATION & CURRETTAGE/HYSTROSCOPY WITH NOVASURE ABLATION N/A 09/09/2013   Procedure: DILATATION & CURETTAGE/HYSTEROSCOPY WITH NOVASURE ABLATION;  Surgeon: Lovenia Kim, MD;  Location: [facility];  Service: Gynecology;  Laterality: N/A;  . ROBOTIC ASSISTED TOTAL HYSTERECTOMY WITH SALPINGECTOMY Bilateral 01/31/2016   Procedure: ROBOTIC ASSISTED TOTAL HYSTERECTOMY WITH SALPINGECTOMY;  Surgeon: Brien Few, MD;  Location: [facility];  Service: Gynecology;  Laterality: Bilateral;    No Known Allergies  Immunization History  Administered Date(s) Administered  . Influenza Split 11/13/2010  . Influenza,inj,Quad PF,6+ Mos 02/01/2016    Family History  Problem Relation Age of Onset  . Heart disease Mother        a-fib and MVP     Current Outpatient Prescriptions:  .  albuterol (PROVENTIL HFA;VENTOLIN HFA) 108 (90 BASE) MCG/ACT inhaler, Inhale 1-2 puffs into the lungs every 6 (six) hours as needed for wheezing or shortness of breath. , Disp: , Rfl:  .  atorvastatin (LIPITOR) 20 MG tablet, Take 20 mg by mouth at bedtime., Disp: , Rfl:  .  budesonide-formoterol (SYMBICORT) 80-4.5 MCG/ACT inhaler, Inhale 2 puffs into the lungs 2 (two) times daily., Disp: 1 Inhaler, Rfl: 12 .  cetirizine (ZYRTEC) 10 MG tablet, Take 10 mg by mouth daily., Disp: , Rfl:    Review of Systems     Objective:   Physical Exam  Constitutional: She is oriented to person, place, and time. She appears well-developed and well-nourished. No distress.  HENT:  Head: Normocephalic and atraumatic.  Right Ear: External ear normal.  Left Ear: External ear normal.  Mouth/Throat: Oropharynx is clear and moist. No oropharyngeal exudate.  Clears throat periodically As postnasal drip  Eyes: Pupils are equal, round, and reactive to light. Conjunctivae and  EOM are normal. Right eye exhibits no discharge. Left eye exhibits no discharge. No scleral icterus.  Neck: Normal range of motion. Neck supple. No JVD present. No tracheal deviation present. No thyromegaly present.  Cardiovascular: Normal rate, regular rhythm, normal heart sounds and intact distal pulses.  Exam reveals no gallop and no friction rub.   No murmur heard. Pulmonary/Chest: Effort normal and breath sounds normal. No respiratory distress. She has no wheezes. She has no rales. She exhibits no tenderness.  Abdominal: Soft. Bowel sounds are normal. She exhibits no distension and no mass. There is no tenderness. There is no rebound and no guarding.  Musculoskeletal: Normal range of motion. She exhibits no edema or tenderness.  Lymphadenopathy:    She has no cervical adenopathy.  Neurological: She is alert and oriented to person, place, and time. She has normal reflexes. No cranial nerve deficit. She exhibits normal muscle tone. Coordination normal.  Skin: Skin is warm and dry. No rash noted. She is not diaphoretic. No erythema. No pallor.  Psychiatric: She has a normal mood and affect. Her behavior is normal. Judgment and thought content normal.  Vitals reviewed.  Vitals:   12/04/16 1006  BP: 126/90  Pulse: 87  SpO2: 99%  Weight: 251 lb 3.2 oz (113.9 kg)  Height: 5\' 4"  (1.626 m)    Estimated body mass index is 43.12 kg/m as calculated from the following:   Height as of this encounter: 5\' 4"  (1.626 m).   Weight as of this encounter: 251 lb 3.2 oz (113.9 kg).     Assessment:       ICD-10-CM   1. Chronic cough R05   2. Irritable larynx syndrome  J38.7        Plan:      Doubt you have asthma I tihnk this is more and more like chronic refractory cough (CRC) or irritable larynx or cough neuropathy Too bad neurontin did not work for you in past  Plan - flu shot 12/04/2016  - for now continue symbicort and nsal steroid   - do CT sinus without contrast, do HRCT and do blood  allergy panel and IgE  - ok to do this in Jan 2019 - if these blood work is normal will refer  To Ernestine Conrad MD  ENT in Jan 2019  Followup  - based on blood work and CT  (> 50% of this 15 min visit spent in face to face counseling or/and coordination of care)   Dr. Brand Males, M.D., [facility].C.P Pulmonary and Critical Care Medicine Staff Physician [place] [facility] Pager: [phone], If no answer or between  15:00h - 7:00h: call [phone]  12/04/2016 10:25 AM

## 2016-12-04 NOTE — Addendum Note (Signed)
Addended by: Lorretta Harp on: 12/04/2016 10:46 AM   Modules accepted: Orders

## 2016-12-05 ENCOUNTER — Telehealth: Payer: Self-pay | Admitting: Internal Medicine

## 2016-12-05 LAB — RESPIRATORY ALLERGY PROFILE REGION II ~~LOC~~
Allergen, A. alternata, m6: <0.1 kU/L
Allergen, Comm Silver Birch, t9: <0.1 kU/L
Allergen, Cottonwood, t14: <0.1 kU/L
Allergen, Mouse Urine Protein, e78: <0.1 kU/L
Allergen, Mulberry, t76: <0.1 kU/L
Allergen, P. notatum, m1: <0.1 kU/L
Aspergillus fumigatus, m3: <0.1 kU/L
Box Elder IgE: <0.1 kU/L
CLADOSPORIUM HERBARUM (M2) IGE: <0.1 kU/L
CLASS: 0
CLASS: 0
CLASS: 0
CLASS: 0
CLASS: 0
CLASS: 0
CLASS: 0
CLASS: 0
CLASS: 0
COMMON RAGWEED (SHORT) (W1) IGE: <0.1 kU/L
Cat Dander: <0.1 kU/L
Class: 0
Class: 0
Class: 0
Class: 0
Class: 0
Class: 0
Class: 0
Class: 0
Class: 0
Class: 0
Class: 0
Class: 0
Class: 0
Class: 0
Class: 0
Dog Dander: <0.1 kU/L
Elm IgE: <0.1 kU/L
IGE (IMMUNOGLOBULIN E), SERUM: 30 kU/L (ref <=114)
Pecan/Hickory Tree IgE: <0.1 kU/L
Rough Pigweed  IgE: <0.1 kU/L

## 2016-12-05 LAB — INTERPRETATION:

## 2016-12-05 NOTE — Telephone Encounter (Signed)
bllod allergy panel and IgE normal - this means asthma likelihood has dropped  Dr. Brand Males, M.D., [facility].C.P Pulmonary and Critical Care Medicine Staff Physician [place] [facility] Pager: [phone], If no answer or between  15:00h - 7:00h: call [phone]  12/05/2016 2:08 PM

## 2016-12-08 NOTE — Telephone Encounter (Signed)
Called pt left a message letting her know the results of her labwork and to call us if she had any questions. Nothing further needed.

## 2017-02-10 ENCOUNTER — Encounter: Payer: Self-pay | Admitting: Radiology

## 2017-02-10 ENCOUNTER — Ambulatory Visit (INDEPENDENT_AMBULATORY_CARE_PROVIDER_SITE_OTHER)
Admission: RE | Admit: 2017-02-10 | Discharge: 2017-02-10 | Disposition: A | Discharge status: Home / Self Care | Payer: Managed Care, Other (non HMO) | Source: Ambulatory Visit | Attending: Internal Medicine | Admitting: Internal Medicine

## 2017-02-10 ENCOUNTER — Telehealth: Payer: Self-pay | Admitting: Internal Medicine

## 2017-02-10 DIAGNOSIS — R053 Chronic cough: Secondary | ICD-10-CM

## 2017-02-10 DIAGNOSIS — R059 Cough, unspecified: Secondary | ICD-10-CM

## 2017-02-10 DIAGNOSIS — J329 Chronic sinusitis, unspecified: Secondary | ICD-10-CM

## 2017-02-10 DIAGNOSIS — R05 Cough: Secondary | ICD-10-CM | POA: Diagnosis not present

## 2017-02-10 DIAGNOSIS — I2584 Coronary atherosclerosis due to calcified coronary lesion: Secondary | ICD-10-CM

## 2017-02-10 DIAGNOSIS — I2510 Atherosclerotic heart disease of native coronary artery without angina pectoris: Secondary | ICD-10-CM

## 2017-02-10 NOTE — Telephone Encounter (Signed)
ENT referral was to Dr Ernestine Conrad at [facility] at last visit - dx chronic cough and chronic sinusitis  Cards referral is for coronary artery calcification  Followup - best I see her but after she completres above. So late Feb 2019 is ffine  Thanks  Dr. Brand Males, M.D., [facility].C.P Pulmonary and Critical Care Medicine Staff Physician, [place] Director - Interstitial Lung Disease  Program  Pulmonary [place] [facility] [place], [place], [address]  Pager: [phone], If no answer or between  15:00h - 7:00h: call [phone] Telephone: [phone]

## 2017-02-10 NOTE — Telephone Encounter (Signed)
Called pt to let her know the results of her HRCT and also the sinus CT.   MR, please advise what diagnosis do you want [place] to associate the referral to cardiology and also the referral to ENT.  Also, are you okay with pt seeing a NP?  Please advise.  Thanks!

## 2017-02-10 NOTE — Telephone Encounter (Signed)
Let Tammy Velez [place]A-Foo know  That CT chest does not show abnormalities like copd, fibrosis, cancer to explain cough. Lung tissue is normal.  Also blood work Nov 2018 is normal. Abnormalities are  1. Small 76mm RML nodule - no followup needed.  2. Single vessel coronary artery atheroslcerosis - probably aging but if no normal cardiac stress test past few years; please refer to cardiologist - [facility] or Dr Einar Gip, first available  3. Sinus CT - this is where there is lot of issues. See red below. Needs to see ENT asap   4. Give fu first avail   Ct Chest High Resolution  Result Date: 02/10/2017 CLINICAL DATA:  Persistent productive cough. Intermittent dyspnea and wheezing with exertion. EXAM: CT CHEST WITHOUT CONTRAST TECHNIQUE: Multidetector CT imaging of the chest was performed following the standard protocol without intravenous contrast. High resolution imaging of the lungs, as well as inspiratory and expiratory imaging, was performed. COMPARISON:  10/03/2015 CT abdomen/pelvis. FINDINGS: Cardiovascular: Normal heart size. No significant pericardial fluid/thickening. Left anterior descending coronary atherosclerosis. Great vessels are normal in course and caliber. Mediastinum/Nodes: No discrete thyroid nodules. Unremarkable esophagus. No pathologically enlarged axillary, mediastinal or gross hilar lymph nodes, noting limited sensitivity for the detection of hilar adenopathy on this noncontrast study. Lungs/Pleura: No pneumothorax. No pleural effusion. No acute consolidative airspace disease or lung masses. Subpleural solid 4 mm right middle lobe pulmonary nodule associated with the minor fissure (series 3/image 72). No additional significant pulmonary nodules. No significant air trapping on the expiration sequence. No significant regions of subpleural reticulation, ground-glass attenuation, traction bronchiectasis, parenchymal banding, architectural distortion or frank honeycombing. Upper abdomen:  Cholecystectomy. Musculoskeletal: No aggressive appearing focal osseous lesions. Mild thoracic spondylosis. IMPRESSION: 1. No evidence of interstitial lung disease. 2. Solitary solid subpleural 4 mm right middle lobe pulmonary nodule, wall certainly benign. No follow-up needed if patient is low-risk. Non-contrast chest CT can be considered in 12 months if patient is high-risk. This recommendation follows the consensus statement: Guidelines for Management of Incidental Pulmonary Nodules Detected on CT Images: From the Fleischner Society 2017; Radiology 2017; 284:228-243. 3. One vessel coronary atherosclerosis. Electronically Signed   By: Ilona Sorrel M.D.   On: 02/10/2017 08:57   [place] Cm  Result Date: 02/10/2017 CLINICAL DATA:  Chronic cough.  Hoarse voice. EXAM: CT PARANASAL SINUS LIMITED WITHOUT CONTRAST TECHNIQUE: Non-contiguous multidetector CT images of the paranasal sinuses were obtained in a single plane without contrast. COMPARISON:  CT head without contrast 06/20/2004 FINDINGS: A fluid level is present in the right maxillary sinus. Mild mucosal thickening is noted along the inferior aspect of the left maxillary sinus. Leftward nasal septal spurring deviates 5 mm from the midline. The ostiomeatal complex is not visualized on this screening exam. Mild mucosal thickening is present in the left frontal sinus. The remaining paranasal sinuses are clear. Limited imaging of the brain is unremarkable. IMPRESSION: 1. Fluid level in the right maxillary sinus suggesting acute sinusitis. 2. Mucosal disease involving the left maxillary sinus and left frontal sinus. 3. Leftward nasal septal deviation without obstruction. Electronically Signed   By: San Morelle M.D.   On: 02/10/2017 09:11

## 2017-02-11 NOTE — Telephone Encounter (Signed)
Referrals placed.    lmtcb X1 to schedule pt for f/up with MR in late 03/2017.

## 2017-02-19 NOTE — Telephone Encounter (Signed)
Attempted to call pt to see if we could get her follow-up appt scheduled with MR in late February 2019, but no answer. Left message for pt to return our call.

## 2017-02-20 NOTE — Telephone Encounter (Signed)
Called and left message for her to call back.

## 2017-02-23 NOTE — Telephone Encounter (Signed)
Attempted to call pt to get a f/u scheduled for pt in late Feb 2019 with MR, but no answer. Left VM for pt to return our call so we can get her appt scheduled.   Since this is the third attempt trying to reach the pt to schedule her appt and no success with reaching her, a letter is going to be sent to her home address and this encounter will be closed.

## 2017-04-06 ENCOUNTER — Encounter: Payer: Self-pay | Admitting: *Deleted

## 2017-04-09 ENCOUNTER — Ambulatory Visit: Payer: Managed Care, Other (non HMO) | Admitting: Cardiology

## 2017-04-09 ENCOUNTER — Encounter: Payer: Self-pay | Admitting: Cardiology

## 2017-04-09 DIAGNOSIS — I2510 Atherosclerotic heart disease of native coronary artery without angina pectoris: Secondary | ICD-10-CM | POA: Insufficient documentation

## 2017-04-09 NOTE — Progress Notes (Deleted)
Cardiology Office Note    Date:  04/09/2017   ID:  SHARDEE DIEU, DOB 04-May-1965, MRN [id number]  PCP:  Lennie Odor, PA-C  Cardiologist:  Fransico Him, MD   No chief complaint on file.   History of Present Illness:  Tammy Velez is a 52 y.o. female who is being seen today for the evaluation of coronary artery calcifications at the request of Redmon, Noelle, PA-C.  This is a 52yo Asian female with a history of diabetes mellitus and allergy induced asthma.  She was recently seen by Dr. Chase Caller due to chronic cough and chronic sinusitis.  A chest CT was done which showed coronary artery calcifications and she is now referred to cardiology for further evaluation.  She denies any chest pain or pressure, shortness of breath, dyspnea on exertion, PND, orthopnea, lower extremity edema, palpitations.  She has no family history of coronary disease and has never smoked.  Chest CT showed coronary artery calcifications in the LAD.  Past Medical History:  Diagnosis Date  . Asthma    Allegery induced  . Complication of anesthesia 11/05/11    Pt re-intubated in OR due to residual muscle weakness  . Constipation   . Coronary artery calcification seen on CAT scan   . Eczema   . Headache    migraine  . History of kidney stones   . Insulin-resistant diabetes mellitus and acanthosis nigricans    diet controlled does not do CBGs  . Left ureteral calculus   . Nausea & vomiting   . Upper respiratory infection     Past Surgical History:  Procedure Laterality Date  . BACK SURGERY  02/02/14   L5- S1 discectomy  . CESAREAN SECTION  2002 &  2005   x2  . CHOLECYSTECTOMY N/A 10/06/2014   Procedure: LAPAROSCOPIC CHOLECYSTECTOMY WITH INTRAOPERATIVE CHOLANGIOGRAM;  Surgeon: Autumn Messing III, MD;  Location: [place];  Service: General;  Laterality: N/A;  . CYSTO/ RIGHT RETROGRADE PYELOGRAM/ RIGHT URETERAL STENT PLACEMENT  10-28-2010   RIGHT RENAL STONE  . CYSTOSCOPY WITH URETEROSCOPY  11/05/2011   Procedure: CYSTOSCOPY WITH URETEROSCOPY;  Surgeon: Bernestine Amass, MD;  Location: [facility];  Service: Urology;  Laterality: Left;  URETEROSCOPY , STONE EXTRACTION POSSIBLE BASKET RETROGRADE AND HOLMIUM LASER AND POSSIBLE STENT   C ARM  LASER   . DILITATION & CURRETTAGE/HYSTROSCOPY WITH NOVASURE ABLATION N/A 09/09/2013   Procedure: DILATATION & CURETTAGE/HYSTEROSCOPY WITH NOVASURE ABLATION;  Surgeon: Lovenia Kim, MD;  Location: [facility];  Service: Gynecology;  Laterality: N/A;  . ROBOTIC ASSISTED TOTAL HYSTERECTOMY WITH SALPINGECTOMY Bilateral 01/31/2016   Procedure: ROBOTIC ASSISTED TOTAL HYSTERECTOMY WITH SALPINGECTOMY;  Surgeon: Brien Few, MD;  Location: [facility];  Service: Gynecology;  Laterality: Bilateral;    Current Medications: No outpatient medications have been marked as taking for the 04/09/17 encounter (Appointment) with Sueanne Margarita, MD.    Allergies:   Patient has no known allergies.   Social History   Socioeconomic History  . Marital status: Married    Spouse name: Not on file  . Number of children: 2  . Years of education: Not on file  . Highest education level: Not on file  Social Needs  . Financial resource strain: Not on file  . Food insecurity - worry: Not on file  . Food insecurity - inability: Not on file  . Transportation needs - medical: Not on file  . Transportation needs - non-medical: Not on file  Occupational History  . Occupation:  department manager  Tobacco Use  . Smoking status: Never Smoker  . Smokeless tobacco: Never Used  Substance and Sexual Activity  . Alcohol use: No    Alcohol/week: 1.7 oz    Types: 2 Glasses of wine, 1 Standard drinks or equivalent per week    Comment: ocassional  . Drug use: No  . Sexual activity: Not on file  Other Topics Concern  . Not on file  Social History Narrative  . Not on file     Family History:  The patient's family history includes Atrial fibrillation in her mother; Heart  disease in her mother; Mitral valve prolapse in her mother.   ROS:   Please see the history of present illness.    ROS All other systems reviewed and are negative.  No flowsheet data found.     PHYSICAL EXAM:   VS:  LMP 01/24/2016 (Exact Date)    GEN: Well nourished, well developed, in no acute distress  HEENT: normal  Neck: no JVD, carotid bruits, or masses Cardiac: RRR; no murmurs, rubs, or gallops,no edema.  Intact distal pulses bilaterally.  Respiratory:  clear to auscultation bilaterally, normal work of breathing GI: soft, nontender, nondistended, + BS MS: no deformity or atrophy  Skin: warm and dry, no rash Neuro:  Alert and Oriented x 3, Strength and sensation are intact Psych: euthymic mood, full affect  Wt Readings from Last 3 Encounters:  12/04/16 251 lb 3.2 oz (113.9 kg)  09/25/16 253 lb (114.8 kg)  01/31/16 261 lb (118.4 kg)      Studies/Labs Reviewed:   EKG:  EKG is ordered today.  The ekg ordered today demonstrates ***  Recent Labs: No results found for requested labs within last 8760 hours.   Lipid Panel No results found for: CHOL, TRIG, HDL, CHOLHDL, VLDL, LDLCALC, LDLDIRECT  Additional studies/ records that were reviewed today include:  Office notes from PCP and CH[facility] CT    ASSESSMENT:    1. Coronary artery calcification seen on CAT scan      PLAN:  In order of problems listed above:  1. Coronary artery calcifications in the LAD noted on chest CT.  She is completely asymptomatic.  Recommend nuclear stress test to rule out ischemia.  I will get a copy of her last fasting lipid panel from her PCP.  Given her diabetes and coronary artery calcifications are  LDL should be less than 70.  If nuclear stress test is normal I will see her back in 1 year.  She has been instructed to let me know she develops any chest pain or pressure, exertional fatigue or dyspnea on exertion.    Medication Adjustments/Labs and Tests Ordered: Current medicines are  reviewed at length with the patient today.  Concerns regarding medicines are outlined above.  Medication changes, Labs and Tests ordered today are listed in the Patient Instructions below.  There are no Patient Instructions on file for this visit.   Signed, Fransico Him, MD  04/09/2017 7:36 AM    [address], [place], [place]  [address] Phone: [phone]; Fax: [phone]

## 2017-04-10 ENCOUNTER — Encounter: Payer: Self-pay | Admitting: Cardiology

## 2017-06-02 ENCOUNTER — Ambulatory Visit (INDEPENDENT_AMBULATORY_CARE_PROVIDER_SITE_OTHER): Payer: Managed Care, Other (non HMO) | Admitting: Cardiology

## 2017-06-02 ENCOUNTER — Telehealth: Payer: Self-pay

## 2017-06-02 ENCOUNTER — Encounter (INDEPENDENT_AMBULATORY_CARE_PROVIDER_SITE_OTHER): Payer: Self-pay

## 2017-06-02 VITALS — BP 130/86 | HR 97 | Ht 64.0 in | Wt 270.5 lb

## 2017-06-02 DIAGNOSIS — E7800 Pure hypercholesterolemia, unspecified: Secondary | ICD-10-CM | POA: Diagnosis not present

## 2017-06-02 DIAGNOSIS — R0602 Shortness of breath: Secondary | ICD-10-CM

## 2017-06-02 DIAGNOSIS — E1100 Type 2 diabetes mellitus with hyperosmolarity without nonketotic hyperglycemic-hyperosmolar coma (NKHHC): Secondary | ICD-10-CM

## 2017-06-02 DIAGNOSIS — I2510 Atherosclerotic heart disease of native coronary artery without angina pectoris: Secondary | ICD-10-CM

## 2017-06-02 DIAGNOSIS — R600 Localized edema: Secondary | ICD-10-CM

## 2017-06-02 DIAGNOSIS — E119 Type 2 diabetes mellitus without complications: Secondary | ICD-10-CM | POA: Insufficient documentation

## 2017-06-02 DIAGNOSIS — E785 Hyperlipidemia, unspecified: Secondary | ICD-10-CM | POA: Insufficient documentation

## 2017-06-02 MED ORDER — ATORVASTATIN CALCIUM 20 MG PO TABS: 20.0000 mg | ORAL_TABLET | Freq: Every day | ORAL | 3 refills | Status: DC

## 2017-06-02 MED ORDER — FUROSEMIDE 20 MG PO TABS: 20.0000 mg | ORAL_TABLET | Freq: Two times a day (BID) | ORAL | 0 refills | Status: DC

## 2017-06-02 MED ORDER — FUROSEMIDE 20 MG PO TABS: 20.0000 mg | ORAL_TABLET | ORAL | 3 refills | Status: DC | PRN

## 2017-06-02 NOTE — Patient Instructions (Addendum)
Medication Instructions:  Your physician has recommended you make the following change in your medication:  START: Lasix 20 mg (1 tablet) two times a day for 3 days and then take 20 mg (1 tablet) as needed for lower leg swelling  RESTART: atorvastatin 20 mg (1 tablet) one time a day   If you need a refill on your cardiac medications, please contact your pharmacy first.  Labwork: Today for kidney function test(BMET), thyroid, BNP, and HbgA1C  Your physician recommends that you return for lab work in: 6 weeks for fasting NMR lipid panel    Testing/Procedures: Your physician has requested that you have an echocardiogram for your lower leg swelling. Echocardiography is a painless test that uses sound waves to create images of your heart. It provides your doctor with information about the size and shape of your heart and how well your heart's chambers and valves are working. This procedure takes approximately one hour. There are no restrictions for this procedure.  Your physician has requested that you have en exercise stress myoview for shortness of breath. For further information please visit [email or web address]. Please follow instruction sheet, as given.  Your physician has requested that you have a lower extremity venous duplex for your lower leg swelling. This test is an ultrasound of the veins in the legs. It looks at venous blood flow that carries blood from the heart to the legs. Allow one hour for a Lower Venous exam. There are no restrictions or special instructions.  Your physician has requested that you have cardiac CT. Cardiac computed tomography (CT) is a painless test that uses an x-ray machine to take clear, detailed pictures of your heart. For further information please visit [email or web address]. Please follow instruction sheet as given. 150.00 dollar co-pay   Follow-Up: Your physician recommends that you schedule a follow-up appointment in: 2 weeks with PA   Your physician  wants you to follow-up in: 1 year with Dr. Radford Pax. You will receive a reminder letter in the mail two months in advance. If you don't receive a letter, please call our office to schedule the follow-up appointment.  Any Other Special Instructions Will Be Listed Below (If Applicable).   Thank you for choosing [facility], RN  [phone]  If you need a refill on your cardiac medications before your next appointment, please call your pharmacy.

## 2017-06-02 NOTE — Telephone Encounter (Signed)
I spoke with pharmacist at [facility]. I informed her that order for lasix may be combined. I gave verbal order for lasix 20 mg BID for 3 days and then lasix 20 mg daily as needed for edema. She verbalized understanding and thankful for the call

## 2017-06-02 NOTE — Progress Notes (Signed)
Cardiology Office Note    Date:  06/02/2017   ID:  AUGUSTA MIRKIN, DOB Sep 13, 1965, MRN [id number]  PCP:  Lennie Odor, PA-C  Cardiologist:  Tammy Him, MD   Chief Complaint  Patient presents with  . New Patient (Initial Visit)    coronary artery calcifications on chest CT, SOB    History of Present Illness:  Tammy Velez is a 52 y.o. female who is being seen today for the evaluation of coronary artery calcifications noted on chest CT at the request of Redmon, Noelle, PA-C.  This is a 52 year old female with a history of asthma and cough who was recently seen by pulmonary and high resolution CT scan was obtained which showed no evidence of interstitial lung disease but did show one-vessel coronary atherosclerosis of the LAD.  She is now referred to cardiology for further evaluation.She is here today for followup and is doing well.  She denies any chest pain or pressure, SOB, DOE, PND, orthopnea, LE edema, dizziness, palpitations or syncope. She is compliant with her meds and is tolerating meds with no SE.    Past Medical History:  Diagnosis Date  . Asthma    Allegery induced  . Complication of anesthesia 11/05/11    Pt re-intubated in OR due to residual muscle weakness  . Constipation   . Coronary artery calcification seen on CAT scan   . Cough 07/21/2010  . Dysmenorrhea 01/31/2016  . Eczema   . Headache    migraine  . History of kidney stones   . Hyperlipidemia   . Insulin-resistant diabetes mellitus and acanthosis nigricans    diet controlled does not do CBGs  . Left ureteral calculus   . Nausea & vomiting   . Upper respiratory infection   . Ureter, calculus 11/05/2011  . URI (upper respiratory infection) 03/10/2011    Past Surgical History:  Procedure Laterality Date  . BACK SURGERY  02/02/14   L5- S1 discectomy  . CESAREAN SECTION  2002 &  2005   x2  . CHOLECYSTECTOMY N/A 10/06/2014   Procedure: LAPAROSCOPIC CHOLECYSTECTOMY WITH INTRAOPERATIVE  CHOLANGIOGRAM;  Surgeon: Autumn Messing III, MD;  Location: [facility];  Service: General;  Laterality: N/A;  . CYSTO/ RIGHT RETROGRADE PYELOGRAM/ RIGHT URETERAL STENT PLACEMENT  10-28-2010   RIGHT RENAL STONE  . CYSTOSCOPY WITH URETEROSCOPY  11/05/2011   Procedure: CYSTOSCOPY WITH URETEROSCOPY;  Surgeon: Bernestine Amass, MD;  Location: [facility];  Service: Urology;  Laterality: Left;  URETEROSCOPY , STONE EXTRACTION POSSIBLE BASKET RETROGRADE AND HOLMIUM LASER AND POSSIBLE STENT   C ARM  LASER   . DILITATION & CURRETTAGE/HYSTROSCOPY WITH NOVASURE ABLATION N/A 09/09/2013   Procedure: DILATATION & CURETTAGE/HYSTEROSCOPY WITH NOVASURE ABLATION;  Surgeon: Lovenia Kim, MD;  Location: [facility];  Service: Gynecology;  Laterality: N/A;  . ROBOTIC ASSISTED TOTAL HYSTERECTOMY WITH SALPINGECTOMY Bilateral 01/31/2016   Procedure: ROBOTIC ASSISTED TOTAL HYSTERECTOMY WITH SALPINGECTOMY;  Surgeon: Brien Few, MD;  Location: [facility];  Service: Gynecology;  Laterality: Bilateral;    Current Medications: Current Meds  Medication Sig  . albuterol (PROVENTIL HFA;VENTOLIN HFA) 108 (90 BASE) MCG/ACT inhaler Inhale 1-2 puffs into the lungs every 6 (six) hours as needed for wheezing or shortness of breath.   Marland Kitchen atorvastatin (LIPITOR) 20 MG tablet Take 20 mg by mouth at bedtime.  . budesonide-formoterol (SYMBICORT) 80-4.5 MCG/ACT inhaler Inhale 2 puffs into the lungs 2 (two) times daily.  . cetirizine (ZYRTEC) 10 MG tablet Take 10 mg by mouth daily.  Marland Kitchen  fluticasone (FLONASE) 50 MCG/ACT nasal spray as directed.  . Naproxen (NAPROSYN PO) Take 1 capsule by mouth as directed.   . pantoprazole (PROTONIX) 40 MG tablet Take 40 mg by mouth as directed.    Allergies:   Patient has no known allergies.   Social History   Socioeconomic History  . Marital status: Married    Spouse name: Not on file  . Number of children: 2  . Years of education: Not on file  . Highest education level: Not on file    Occupational History  . Occupation: Teacher, English as a foreign language  Social Needs  . Financial resource strain: Not on file  . Food insecurity:    Worry: Not on file    Inability: Not on file  . Transportation needs:    Medical: Not on file    Non-medical: Not on file  Tobacco Use  . Smoking status: Never Smoker  . Smokeless tobacco: Never Used  Substance and Sexual Activity  . Alcohol use: No    Alcohol/week: 1.7 oz    Types: 2 Glasses of wine, 1 Standard drinks or equivalent per week    Comment: ocassional  . Drug use: No  . Sexual activity: Not on file  Lifestyle  . Physical activity:    Days per week: Not on file    Minutes per session: Not on file  . Stress: Not on file  Relationships  . Social connections:    Talks on phone: Not on file    Gets together: Not on file    Attends religious service: Not on file    Active member of club or organization: Not on file    Attends meetings of clubs or organizations: Not on file    Relationship status: Not on file  Other Topics Concern  . Not on file  Social History Narrative  . Not on file     Family History:  The patient's family history includes Atrial fibrillation in her mother; Heart disease in her mother; Mitral valve prolapse in her mother.   ROS:   Please see the history of present illness.    ROS All other systems reviewed and are negative.  No flowsheet data found.     PHYSICAL EXAM:   VS:  BP 130/86   Pulse 97   Ht 5\' 4"  (1.626 m)   Wt 270 lb 8 oz (122.7 kg)   LMP 01/24/2016 (Exact Date)   SpO2 95%   BMI 46.43 kg/m    GEN: Well nourished, well developed, in no acute distress  HEENT: normal  Neck: no JVD, carotid bruits, or masses Cardiac: RRR; no murmurs, rubs, or gallops,no edema.  Intact distal pulses bilaterally.  Respiratory:  clear to auscultation bilaterally, normal work of breathing GI: soft, nontender, nondistended, + BS MS: no deformity or atrophy  Skin: warm and dry, no rash Neuro:  Alert and  Oriented x 3, Strength and sensation are intact Psych: euthymic mood, full affect  Wt Readings from Last 3 Encounters:  06/02/17 270 lb 8 oz (122.7 kg)  12/04/16 251 lb 3.2 oz (113.9 kg)  09/25/16 253 lb (114.8 kg)      Studies/Labs Reviewed:   EKG:  EKG is ordered today.  The ekg ordered today demonstrates sinus rhythm at 91 bpm with LVH by voltage criteria.  Recent Labs: No results found for requested labs within last 8760 hours.   Lipid Panel No results found for: CHOL, TRIG, HDL, CHOLHDL, VLDL, LDLCALC, LDLDIRECT  Additional studies/ records that  were reviewed today include:  Office notes from Pulmonary  ASSESSMENT:    1. Coronary artery calcification seen on CAT scan   2. Type 2 diabetes mellitus with hyperosmolarity without coma, unspecified whether long term insulin use (HCC)   3. Pure hypercholesterolemia      PLAN:  In order of problems listed above:  1.  Coronary artery calcifications of the LAD noted on CT scan.  She is completely asymptomatic from a cardiac standpoint with no chest pain or pressure.  She does have chronic shortness of breath which has been presumed to be from asthma and GERD in the past. Her EKG is nonischemic.  Her cardiac risk factors include diabetes and a family history of heart disease in the past.   I recommend getting a chest CT calcium score to assess future risk.  She needs aggressive risk factor modification.  Her blood pressure is controlled.  I am going to get a stress Myoview to rule out ischemia given her shortness of breath as well to make sure we are not missing a cardiac etiology to her dyspnea on exertion.  2.  Hyperlipidemia -she has not been taking her Lipitor in 4 weeks because she ran out.  I will refill her Lipitor 20 mg daily and repeat a fasting lipid panel and NMR profile in 6 weeks.  3.  Diabetes type 2 -  Check a hemoglobin A1c as she has not had that checked since 2015.  Her diabetes is followed by her PCP.  4.  Marked  lower extremity edema -she recently traveled to [place] and got back on Sunday.  She says all in all it was a 6-hour flight plus travel in her car to get home from the airport.  She says the lower extremity edema has fluctuated in severity but she has marked lower extremity edema today.  I do not feel any palpable cords in her legs.  Given her recent travel I think it is prudent to get lower extremity venous Dopplers to rule out DVT.  I am also getting get a 2D echocardiogram to evaluate LV function and rule out diastolic dysfunction.  I will check a BNP and BMETtoday.  I have instructed her to start taking Lasix 20 mg twice daily for 3 days and then 20 mg daily as needed for edema.  I will have her follow-up with my extender in 2 weeks to make sure this has improved.    Medication Adjustments/Labs and Tests Ordered: Current medicines are reviewed at length with the patient today.  Concerns regarding medicines are outlined above.  Medication changes, Labs and Tests ordered today are listed in the Patient Instructions below.  There are no Patient Instructions on file for this visit.   Signed, Tammy Him, MD  06/02/2017 3:22 PM    [facility] [address], [place], [place]  [address] Phone: [phone]; Fax: [phone]

## 2017-06-03 ENCOUNTER — Ambulatory Visit (HOSPITAL_COMMUNITY)
Admission: RE | Admit: 2017-06-03 | Discharge: 2017-06-03 | Disposition: A | Discharge status: Home / Self Care | Payer: Managed Care, Other (non HMO) | Source: Ambulatory Visit | Attending: Cardiology | Admitting: Cardiology

## 2017-06-03 DIAGNOSIS — R600 Localized edema: Secondary | ICD-10-CM | POA: Diagnosis not present

## 2017-06-03 LAB — BASIC METABOLIC PANEL
BUN/Creatinine Ratio: 13 (ref 9–23)
BUN: 12 mg/dL (ref 6–24)
CALCIUM: 9 mg/dL (ref 8.7–10.2)
CO2: 25 mmol/L (ref 20–29)
Chloride: 103 mmol/L (ref 96–106)
Creatinine, Ser: 0.92 mg/dL (ref 0.57–1.00)
GFR calc Af Amer: 84 mL/min/{1.73_m2} (ref >59)
GFR, EST NON AFRICAN AMERICAN: 73 mL/min/{1.73_m2} (ref >59)
Glucose: 98 mg/dL (ref 65–99)
POTASSIUM: 4.2 mmol/L (ref 3.5–5.2)
Sodium: 144 mmol/L (ref 134–144)

## 2017-06-03 LAB — PRO B NATRIURETIC PEPTIDE: NT-PRO BNP: 32 pg/mL (ref 0–249)

## 2017-06-03 LAB — HEMOGLOBIN A1C
Est. average glucose Bld gHb Est-mCnc: 108 mg/dL
HEMOGLOBIN A1C: 5.4 % (ref 4.8–5.6)

## 2017-06-03 LAB — TSH: TSH: 2.5 u[IU]/mL (ref 0.450–4.500)

## 2017-06-16 ENCOUNTER — Telehealth (HOSPITAL_COMMUNITY): Payer: Self-pay | Admitting: *Deleted

## 2017-06-16 NOTE — Telephone Encounter (Signed)
Patient given detailed instructions per Myocardial Perfusion Study Information Sheet for the test on 06/18/17 at 1230. Patient notified to arrive 15 minutes early and that it is imperative to arrive on time for appointment to keep from having the test rescheduled.  If you need to cancel or reschedule your appointment, please call the office within 24 hours of your appointment. . Patient verbalized understanding.Jacquelyn Shadrick, Ranae Palms

## 2017-06-17 ENCOUNTER — Telehealth: Payer: Self-pay | Admitting: *Deleted

## 2017-06-17 NOTE — Telephone Encounter (Signed)
LMOVM TO CONTACT OFFICE BACK TO RESCHEDULE TO NEXT WEEK  OR ANOTHER APPOINTMENT TIME AFTER TEST ARE DONE.

## 2017-06-17 NOTE — Progress Notes (Deleted)
Cardiology Office Note:    Date:  06/17/2017   ID:  Tammy Velez, DOB 02/03/1966, MRN [id number]  PCP:  Lennie Odor, PA-C  Cardiologist:  No primary care provider on file.   Referring MD: Lennie Odor, PA-C   No chief complaint on file. ***  History of Present Illness:    Tammy Velez is a 52 y.o. female with a hx of CAD on CT, DM, and HLD. She was initially referred to cardiology after coronary calcification was seen on CT chest. She saw Dr. Radford Pax on 06/02/17. At that time, Dr. Radford Pax ordered a calcium score, nuclear medicine stress test, echocardiogram, and lower extremity dopplers to rule out DVT given recent travel to [place].       Past Medical History:  Diagnosis Date  . Asthma    Allegery induced  . Complication of anesthesia 11/05/11    Pt re-intubated in OR due to residual muscle weakness  . Constipation   . Coronary artery calcification seen on CAT scan   . Cough 07/21/2010  . Dysmenorrhea 01/31/2016  . Eczema   . Headache    migraine  . History of kidney stones   . Hyperlipidemia   . Insulin-resistant diabetes mellitus and acanthosis nigricans    diet controlled does not do CBGs  . Left ureteral calculus   . Nausea & vomiting   . Upper respiratory infection   . Ureter, calculus 11/05/2011  . URI (upper respiratory infection) 03/10/2011    Past Surgical History:  Procedure Laterality Date  . BACK SURGERY  02/02/14   L5- S1 discectomy  . CESAREAN SECTION  2002 &  2005   x2  . CHOLECYSTECTOMY N/A 10/06/2014   Procedure: LAPAROSCOPIC CHOLECYSTECTOMY WITH INTRAOPERATIVE CHOLANGIOGRAM;  Surgeon: Autumn Messing III, MD;  Location: [facility];  Service: General;  Laterality: N/A;  . CYSTO/ RIGHT RETROGRADE PYELOGRAM/ RIGHT URETERAL STENT PLACEMENT  10-28-2010   RIGHT RENAL STONE  . CYSTOSCOPY WITH URETEROSCOPY  11/05/2011   Procedure: CYSTOSCOPY WITH URETEROSCOPY;  Surgeon: Bernestine Amass, MD;  Location: [facility];  Service: Urology;   Laterality: Left;  URETEROSCOPY , STONE EXTRACTION POSSIBLE BASKET RETROGRADE AND HOLMIUM LASER AND POSSIBLE STENT   C ARM  LASER   . DILITATION & CURRETTAGE/HYSTROSCOPY WITH NOVASURE ABLATION N/A 09/09/2013   Procedure: DILATATION & CURETTAGE/HYSTEROSCOPY WITH NOVASURE ABLATION;  Surgeon: Lovenia Kim, MD;  Location: [facility];  Service: Gynecology;  Laterality: N/A;  . ROBOTIC ASSISTED TOTAL HYSTERECTOMY WITH SALPINGECTOMY Bilateral 01/31/2016   Procedure: ROBOTIC ASSISTED TOTAL HYSTERECTOMY WITH SALPINGECTOMY;  Surgeon: Brien Few, MD;  Location: [facility];  Service: Gynecology;  Laterality: Bilateral;    Current Medications: No outpatient medications have been marked as taking for the 06/18/17 encounter (Appointment) with Consuelo Pandy, PA-C.     Allergies:   Patient has no known allergies.   Social History   Socioeconomic History  . Marital status: Married    Spouse name: Not on file  . Number of children: 2  . Years of education: Not on file  . Highest education level: Not on file  Occupational History  . Occupation: Teacher, English as a foreign language  Social Needs  . Financial resource strain: Not on file  . Food insecurity:    Worry: Not on file    Inability: Not on file  . Transportation needs:    Medical: Not on file    Non-medical: Not on file  Tobacco Use  . Smoking status: Never Smoker  . Smokeless tobacco: Never  Used  Substance and Sexual Activity  . Alcohol use: No    Alcohol/week: 1.7 oz    Types: 2 Glasses of wine, 1 Standard drinks or equivalent per week    Comment: ocassional  . Drug use: No  . Sexual activity: Not on file  Lifestyle  . Physical activity:    Days per week: Not on file    Minutes per session: Not on file  . Stress: Not on file  Relationships  . Social connections:    Talks on phone: Not on file    Gets together: Not on file    Attends religious service: Not on file    Active member of club or organization: Not on file    Attends  meetings of clubs or organizations: Not on file    Relationship status: Not on file  Other Topics Concern  . Not on file  Social History Narrative  . Not on file     Family History: The patient's ***family history includes Atrial fibrillation in her mother; Heart disease in her mother; Mitral valve prolapse in her mother.  ROS:   Please see the history of present illness.    *** All other systems reviewed and are negative.  EKGs/Labs/Other Studies Reviewed:    The following studies were reviewed today: ***  EKG:  EKG is *** ordered today.  The ekg ordered today demonstrates ***  Recent Labs: 06/02/2017: BUN 12; Creatinine, Ser 0.92; NT-Pro BNP 32; Potassium 4.2; Sodium 144; TSH 2.500  Recent Lipid Panel No results found for: CHOL, TRIG, HDL, CHOLHDL, VLDL, LDLCALC, LDLDIRECT  Physical Exam:    VS:  LMP 01/24/2016 (Exact Date)     Wt Readings from Last 3 Encounters:  06/02/17 270 lb 8 oz (122.7 kg)  12/04/16 251 lb 3.2 oz (113.9 kg)  09/25/16 253 lb (114.8 kg)     GEN: *** Well nourished, well developed in no acute distress HEENT: Normal NECK: No JVD; No carotid bruits LYMPHATICS: No lymphadenopathy CARDIAC: ***RRR, no murmurs, rubs, gallops RESPIRATORY:  Clear to auscultation without rales, wheezing or rhonchi  ABDOMEN: Soft, non-tender, non-distended MUSCULOSKELETAL:  No edema; No deformity  SKIN: Warm and dry NEUROLOGIC:  Alert and oriented x 3 PSYCHIATRIC:  Normal affect   ASSESSMENT:    No diagnosis found. PLAN:    In order of problems listed above:  No diagnosis found.   Medication Adjustments/Labs and Tests Ordered: Current medicines are reviewed at length with the patient today.  Concerns regarding medicines are outlined above.  No orders of the defined types were placed in this encounter.  No orders of the defined types were placed in this encounter.   Signed, Ledora Bottcher, PA  06/17/2017 4:01 PM    [facility]

## 2017-06-18 ENCOUNTER — Other Ambulatory Visit: Payer: Self-pay

## 2017-06-18 ENCOUNTER — Ambulatory Visit (HOSPITAL_COMMUNITY): Payer: Managed Care, Other (non HMO) | Attending: Cardiovascular Disease

## 2017-06-18 ENCOUNTER — Ambulatory Visit: Payer: Managed Care, Other (non HMO) | Admitting: Cardiology

## 2017-06-18 ENCOUNTER — Ambulatory Visit (HOSPITAL_BASED_OUTPATIENT_CLINIC_OR_DEPARTMENT_OTHER): Payer: Managed Care, Other (non HMO)

## 2017-06-18 ENCOUNTER — Ambulatory Visit (INDEPENDENT_AMBULATORY_CARE_PROVIDER_SITE_OTHER)
Admission: RE | Admit: 2017-06-18 | Discharge: 2017-06-18 | Disposition: A | Discharge status: Home / Self Care | Payer: Self-pay | Source: Ambulatory Visit | Attending: Cardiology | Admitting: Cardiology

## 2017-06-18 DIAGNOSIS — R0602 Shortness of breath: Secondary | ICD-10-CM

## 2017-06-18 DIAGNOSIS — E785 Hyperlipidemia, unspecified: Secondary | ICD-10-CM | POA: Insufficient documentation

## 2017-06-18 DIAGNOSIS — R600 Localized edema: Secondary | ICD-10-CM | POA: Diagnosis not present

## 2017-06-18 DIAGNOSIS — E119 Type 2 diabetes mellitus without complications: Secondary | ICD-10-CM | POA: Diagnosis not present

## 2017-06-18 DIAGNOSIS — I2510 Atherosclerotic heart disease of native coronary artery without angina pectoris: Secondary | ICD-10-CM

## 2017-06-18 MED ORDER — TECHNETIUM TC 99M TETROFOSMIN IV KIT
33.0000 | PACK | Freq: Once | INTRAVENOUS | Status: AC | PRN
  Administered 2017-06-18: 33 via INTRAVENOUS
  Filled 2017-06-18: qty 33

## 2017-06-18 MED ORDER — REGADENOSON 0.4 MG/5ML IV SOLN
0.4000 mg | Freq: Once | INTRAVENOUS | Status: AC
  Administered 2017-06-18: 0.4 mg via INTRAVENOUS

## 2017-06-19 ENCOUNTER — Ambulatory Visit (HOSPITAL_COMMUNITY): Payer: Managed Care, Other (non HMO) | Attending: Cardiology

## 2017-06-19 LAB — MYOCARDIAL PERFUSION IMAGING
CHL CUP NUCLEAR SDS: 1
LHR: 0.5
LV dias vol: 108 mL (ref 46–106)
LV sys vol: 50 mL
Peak HR: 125 {beats}/min
Rest HR: 86 {beats}/min
SRS: 7
SSS: 8
TID: 0.93

## 2017-06-19 MED ORDER — TECHNETIUM TC 99M TETROFOSMIN IV KIT
32.4000 | PACK | Freq: Once | INTRAVENOUS | Status: AC | PRN
  Administered 2017-06-19: 32.4 via INTRAVENOUS
  Filled 2017-06-19: qty 33

## 2017-06-24 ENCOUNTER — Ambulatory Visit: Payer: Managed Care, Other (non HMO) | Admitting: Nurse Practitioner

## 2017-06-24 NOTE — Progress Notes (Deleted)
CARDIOLOGY OFFICE NOTE  Date:  06/24/2017    Tammy Velez Date of Birth: 09/21/1965 Medical Record #[id number]  PCP:  Lennie Odor, PA-C  Cardiologist:  Timmie Foerster chief complaint on file.   History of Present Illness: Tammy Velez is a 52 y.o. female who presents today for a *** who is being seen today for the evaluation of coronary artery calcifications noted on chest CT at the request of Redmon, Noelle, PA-C.  This is a 52 year old female with a history of asthma and cough who was recently seen by pulmonary and high resolution CT scan was obtained which showed no evidence of interstitial lung disease but did show one-vessel coronary atherosclerosis of the LAD.  She is now referred to cardiology for further evaluation.She is here today for followup and is doing well.  She denies any chest pain or pressure, SOB, DOE, PND, orthopnea, LE edema, dizziness, palpitations or syncope. She is compliant with her meds and is tolerating meds with no SE.     Comes in today. Here with   Past Medical History:  Diagnosis Date  . Asthma    Allegery induced  . Complication of anesthesia 11/05/11    Pt re-intubated in OR due to residual muscle weakness  . Constipation   . Coronary artery calcification seen on CAT scan   . Cough 07/21/2010  . Dysmenorrhea 01/31/2016  . Eczema   . Headache    migraine  . History of kidney stones   . Hyperlipidemia   . Insulin-resistant diabetes mellitus and acanthosis nigricans    diet controlled does not do CBGs  . Left ureteral calculus   . Nausea & vomiting   . Upper respiratory infection   . Ureter, calculus 11/05/2011  . URI (upper respiratory infection) 03/10/2011    Past Surgical History:  Procedure Laterality Date  . BACK SURGERY  02/02/14   L5- S1 discectomy  . CESAREAN SECTION  2002 &  2005   x2  . CHOLECYSTECTOMY N/A 10/06/2014   Procedure: LAPAROSCOPIC CHOLECYSTECTOMY WITH INTRAOPERATIVE CHOLANGIOGRAM;  Surgeon: Autumn Messing  III, MD;  Location: [facility];  Service: General;  Laterality: N/A;  . CYSTO/ RIGHT RETROGRADE PYELOGRAM/ RIGHT URETERAL STENT PLACEMENT  10-28-2010   RIGHT RENAL STONE  . CYSTOSCOPY WITH URETEROSCOPY  11/05/2011   Procedure: CYSTOSCOPY WITH URETEROSCOPY;  Surgeon: Bernestine Amass, MD;  Location: [facility];  Service: Urology;  Laterality: Left;  URETEROSCOPY , STONE EXTRACTION POSSIBLE BASKET RETROGRADE AND HOLMIUM LASER AND POSSIBLE STENT   C ARM  LASER   . DILITATION & CURRETTAGE/HYSTROSCOPY WITH NOVASURE ABLATION N/A 09/09/2013   Procedure: DILATATION & CURETTAGE/HYSTEROSCOPY WITH NOVASURE ABLATION;  Surgeon: Lovenia Kim, MD;  Location: [facility];  Service: Gynecology;  Laterality: N/A;  . ROBOTIC ASSISTED TOTAL HYSTERECTOMY WITH SALPINGECTOMY Bilateral 01/31/2016   Procedure: ROBOTIC ASSISTED TOTAL HYSTERECTOMY WITH SALPINGECTOMY;  Surgeon: Brien Few, MD;  Location: [facility];  Service: Gynecology;  Laterality: Bilateral;     Medications: No outpatient medications have been marked as taking for the 06/24/17 encounter (Appointment) with Burtis Junes, NP.     Allergies: No Known Allergies  Social History: The patient  reports that she has never smoked. She has never used smokeless tobacco. She reports that she does not drink alcohol or use drugs.   Family History: The patient's ***family history includes Atrial fibrillation in her mother; Heart disease in her mother; Mitral valve prolapse in her mother.   Review of Systems:  Please see the history of present illness.   Otherwise, the review of systems is positive for {NONE DEFAULTED:18576::"none"}.   All other systems are reviewed and negative.   Physical Exam: VS:  LMP 01/24/2016 (Exact Date)  .  BMI There is no height or weight on file to calculate BMI.  Wt Readings from Last 3 Encounters:  06/18/17 270 lb (122.5 kg)  06/02/17 270 lb 8 oz (122.7 kg)  12/04/16 251 lb 3.2 oz (113.9 kg)    General: Pleasant.  Well developed, well nourished and in no acute distress.   HEENT: Normal.  Neck: Supple, no JVD, carotid bruits, or masses noted.  Cardiac: ***Regular rate and rhythm. No murmurs, rubs, or gallops. No edema.  Respiratory:  Lungs are clear to auscultation bilaterally with normal work of breathing.  GI: Soft and nontender.  MS: No deformity or atrophy. Gait and ROM intact.  Skin: Warm and dry. Color is normal.  Neuro:  Strength and sensation are intact and no gross focal deficits noted.  Psych: Alert, appropriate and with normal affect.   LABORATORY DATA:  EKG:  EKG {ACTION; IS/IS UJW:11914782} ordered today. This demonstrates ***.  Lab Results  Component Value Date   WBC 11.9 (H) 02/01/2016   HGB 11.6 (L) 02/01/2016   HCT 35.4 (L) 02/01/2016   PLT 333 02/01/2016   GLUCOSE 98 06/02/2017   ALT 13 (L) 10/03/2014   AST 15 10/03/2014   NA 144 06/02/2017   K 4.2 06/02/2017   CL 103 06/02/2017   CREATININE 0.92 06/02/2017   BUN 12 06/02/2017   CO2 25 06/02/2017   TSH 2.500 06/02/2017   HGBA1C 5.4 06/02/2017     BNP (last 3 results) No results for input(s): BNP in the last 8760 hours.  ProBNP (last 3 results) Recent Labs    06/02/17 1618  PROBNP 32     Other Studies Reviewed Today:   Echo Study Highlights 06/2017    Nuclear stress EF: 53%.  There was no ST segment deviation noted during stress.  This is a low risk study.  The left ventricular ejection fraction is mildly decreased (45-54%).   1. EF 53% but no wall motion abnormalities noted.  Consider EF confirmation by echo.  2. Fixed small, mild mid to apical anteroseptal perfusion defect.  Given normal wall motion, this may be attenuation.  No ischemia.   Low risk study.    Echo Study Conclusions 06/2017  - Left ventricle: The cavity size was normal. Wall thickness was   normal. Systolic function was normal. The estimated ejection   fraction was in the range of 60% to 65%. Wall motion was normal;    there were no regional wall motion abnormalities. Doppler   parameters are consistent with abnormal left ventricular   relaxation (grade 1 diastolic dysfunction).  LE Venous Doppler Final Interpretation 06/2017: Right: No evidence of deep vein thrombosis in the lower extremity. No indirect evidence of obstruction proximal to the inguinal ligament. Normal reflux times were noted in the common femoral vein. Left: No evidence of deep vein thrombosis in the lower extremity. No indirect evidence of obstruction proximal to the inguinal ligament.  *See table(s) above for measurements and observations.  Electronically signed by Larae Grooms on 06/04/2017 at 9:41:08 AM.    CT Cardiac Scoring IMPRESSION 06/2017: Stable 4 mm anterior right middle lobe pulmonary nodule. No follow-up needed if patient is low-risk. Non-contrast chest CT can be considered in 12 months if patient is high-risk. This recommendation follows  the consensus statement: Guidelines for Management of Incidental Pulmonary Nodules Detected on CT Images: From the Fleischner Society 2017; Radiology 2017; 284:228-243.  Electronically Signed: By: Rolm Baptise M.D. On: 06/18/2017 11:21  CT Cardiac Scoring IMPRESSION 06/2017: Coronary calcium score of 5. This was 43 th percentile for age and sex matched control.  [place] [place]    Assessment/Plan: 1.  Coronary artery calcifications of the LAD noted on CT scan.  She is completely asymptomatic from a cardiac standpoint with no chest pain or pressure.  She does have chronic shortness of breath which has been presumed to be from asthma and GERD in the past. Her EKG is nonischemic.  Her cardiac risk factors include diabetes and a family history of heart disease in the past.   I recommend getting a chest CT calcium score to assess future risk.  She needs aggressive risk factor modification.  Her blood pressure is controlled.  I am going to get a stress Myoview to rule out ischemia  given her shortness of breath as well to make sure we are not missing a cardiac etiology to her dyspnea on exertion.  2.  Hyperlipidemia -she has not been taking her Lipitor in 4 weeks because she ran out.  I will refill her Lipitor 20 mg daily and repeat a fasting lipid panel and NMR profile in 6 weeks.  3.  Diabetes type 2 -  Check a hemoglobin A1c as she has not had that checked since 2015.  Her diabetes is followed by her PCP.  4.  Marked lower extremity edema -she recently traveled to [place] and got back on Sunday.  She says all in all it was a 6-hour flight plus travel in her car to get home from the airport.  She says the lower extremity edema has fluctuated in severity but she has marked lower extremity edema today.  I do not feel any palpable cords in her legs.  Given her recent travel I think it is prudent to get lower extremity venous Dopplers to rule out DVT.  I am also getting get a 2D echocardiogram to evaluate LV function and rule out diastolic dysfunction.  I will check a BNP and BMETtoday.  I have instructed her to start taking Lasix 20 mg twice daily for 3 days and then 20 mg daily as needed for edema.  I will have her follow-up with my extender in 2 weeks to make sure this has improved.    Current medicines are reviewed with the patient today.  The patient does not have concerns regarding medicines other than what has been noted above.  The following changes have been made:  See above.  Labs/ tests ordered today include:   No orders of the defined types were placed in this encounter.    Disposition:   FU with *** in {gen number 2-77:412878} {Days to years:10300}.   Patient is agreeable to this plan and will call if any problems develop in the interim.   SignedTruitt Merle, NP  06/24/2017 7:43 AM  [facility] [address] [place],   [address] Phone: [phone] Fax: [phone]

## 2017-06-25 ENCOUNTER — Encounter: Payer: Self-pay | Admitting: Nurse Practitioner

## 2017-07-15 ENCOUNTER — Other Ambulatory Visit: Payer: Managed Care, Other (non HMO) | Admitting: *Deleted

## 2017-07-15 DIAGNOSIS — E7800 Pure hypercholesterolemia, unspecified: Secondary | ICD-10-CM

## 2017-07-16 LAB — NMR, LIPOPROFILE
CHOLESTEROL, TOTAL: 190 mg/dL (ref 100–199)
HDL PARTICLE NUMBER: 33.1 umol/L (ref >=30.5)
HDL-C: 51 mg/dL (ref >39)
LDL Particle Number: 1334 nmol/L — ABNORMAL HIGH (ref <1000)
LDL Size: 21 nm (ref >20.5)
LDL-C: 108 mg/dL — AB (ref 0–99)
LP-IR SCORE: 57 — AB (ref <=45)
Small LDL Particle Number: 458 nmol/L (ref <=527)
TRIGLYCERIDES: 153 mg/dL — AB (ref 0–149)

## 2017-07-16 LAB — APOLIPOPROTEIN B: Apolipoprotein B: 98 mg/dL — ABNORMAL HIGH (ref <90)

## 2017-07-16 LAB — LIPOPROTEIN A (LPA): Lipoprotein (a): 222 nmol/L — ABNORMAL HIGH (ref <75)

## 2017-07-22 ENCOUNTER — Telehealth: Payer: Self-pay

## 2017-07-22 DIAGNOSIS — E7841 Elevated Lipoprotein(a): Secondary | ICD-10-CM

## 2017-07-22 MED ORDER — ATORVASTATIN CALCIUM 40 MG PO TABS: 40.0000 mg | ORAL_TABLET | Freq: Every day | ORAL | 3 refills | Status: DC

## 2017-07-22 NOTE — Telephone Encounter (Signed)
Notes recorded by Teressa Senter, RN on 07/22/2017 at 11:10 AM EDT Pt made aware of lab results and Dr. Theodosia Blender recommendation to increase Lipitor to 40 mg once a day and repeat labs in 6 weeks. Pt stated understanding and thankful for the call, she is scheduled for labs on 8/16/9.  Notes recorded by Sueanne Margarita, MD on 07/16/2017 at 2:01 PM EDT Still too high - increase lipitor to 40mg  daily and repeat NMR with FLP And ALT in 6 weeks

## 2017-09-18 ENCOUNTER — Other Ambulatory Visit: Payer: Managed Care, Other (non HMO)

## 2017-12-20 ENCOUNTER — Other Ambulatory Visit: Payer: Self-pay | Admitting: Internal Medicine

## 2018-01-22 ENCOUNTER — Other Ambulatory Visit (HOSPITAL_COMMUNITY): Payer: Self-pay | Admitting: Chiropractic Medicine

## 2018-01-22 ENCOUNTER — Ambulatory Visit (HOSPITAL_COMMUNITY)
Admission: RE | Admit: 2018-01-22 | Discharge: 2018-01-22 | Disposition: A | Discharge status: Home / Self Care | Payer: Managed Care, Other (non HMO) | Source: Ambulatory Visit | Attending: Chiropractic Medicine | Admitting: Chiropractic Medicine

## 2018-01-22 DIAGNOSIS — M542 Cervicalgia: Secondary | ICD-10-CM

## 2018-01-22 DIAGNOSIS — R51 Headache: Secondary | ICD-10-CM | POA: Insufficient documentation

## 2018-01-22 DIAGNOSIS — R519 Headache, unspecified: Secondary | ICD-10-CM

## 2018-02-05 ENCOUNTER — Ambulatory Visit
Admission: RE | Admit: 2018-02-05 | Discharge: 2018-02-05 | Disposition: A | Discharge status: Home / Self Care | Payer: Managed Care, Other (non HMO) | Source: Ambulatory Visit | Attending: Physician Assistant | Admitting: Physician Assistant

## 2018-02-05 ENCOUNTER — Other Ambulatory Visit: Payer: Self-pay | Admitting: Physician Assistant

## 2018-02-05 DIAGNOSIS — R52 Pain, unspecified: Secondary | ICD-10-CM

## 2018-07-09 ENCOUNTER — Ambulatory Visit: Payer: Managed Care, Other (non HMO) | Admitting: Cardiology

## 2018-07-16 ENCOUNTER — Other Ambulatory Visit: Payer: Self-pay | Admitting: Cardiology

## 2018-07-16 MED ORDER — ATORVASTATIN CALCIUM 40 MG PO TABS: 40.0000 mg | ORAL_TABLET | Freq: Every day | ORAL | 0 refills | Status: DC

## 2018-07-16 NOTE — Telephone Encounter (Signed)
Pt's medication was sent to pt's pharmacy as requested. Confirmation received.  °

## 2018-09-24 ENCOUNTER — Telehealth: Payer: Self-pay | Admitting: *Deleted

## 2018-09-24 ENCOUNTER — Other Ambulatory Visit: Payer: Self-pay | Admitting: *Deleted

## 2018-09-24 NOTE — Telephone Encounter (Signed)

## 2018-09-26 NOTE — Progress Notes (Signed)
Virtual Visit via Telephone Note   This visit type was conducted due to national recommendations for restrictions regarding the COVID-19 Pandemic (e.g. social distancing) in an effort to limit this patient's exposure and mitigate transmission in our community.  Due to her co-morbid illnesses, this patient is at least at moderate risk for complications without adequate follow up.  This format is felt to be most appropriate for this patient at this time.  The patient did not have access to video technology/had technical difficulties with video requiring transitioning to audio format only (telephone).  All issues noted in this document were discussed and addressed.  No physical exam could be performed with this format.  Please refer to the patient's chart for her  consent to telehealth for [facility].   Evaluation Performed:  Follow-up visit  This visit type was conducted due to national recommendations for restrictions regarding the COVID-19 Pandemic (e.g. social distancing).  This format is felt to be most appropriate for this patient at this time.  All issues noted in this document were discussed and addressed.  No physical exam was performed (except for noted visual exam findings with Video Visits).  Please refer to the patient's chart (MyChart message for video visits and phone note for telephone visits) for the patient's consent to telehealth for [facility].  Date:  09/27/2018   ID:  Tammy Velez, DOB Feb 10, 1965, MRN [id number]  Patient Location:  Home  Provider location:   [place]  PCP:  Lennie Odor, PA-C  Cardiologist:  Fransico Him, MD Electrophysiologist:  None   Chief Complaint:  Coronary artery calcifications  History of Present Illness:    Tammy Velez is a 53 y.o. female who presents via audio/video conferencing for a telehealth visit today.    This is a 53yo female with a history of type 2 DM, HLD and LE edema as well as asthma and cough who was  found on Chest CT to have one-vessel coronary atherosclerosis of the LAD. Coronary Ca score was done which was low at 5.  Nuclear stress test showed no ischemia.    She is here today for followup and is doing well.  She denies any chest pain or pressure, SOB, DOE, PND, orthopnea, LE edema, dizziness, palpitations or syncope. She is compliant with her meds and is tolerating meds with no SE.    The patient does not have symptoms concerning for COVID-19 infection (fever, chills, cough, or new shortness of breath).    Prior CV studies:   The following studies were reviewed today:  Stress test and calcium score  Past Medical History:  Diagnosis Date  . Asthma    Allegery induced  . Complication of anesthesia 11/05/11    Pt re-intubated in OR due to residual muscle weakness  . Constipation   . Coronary artery calcification seen on CAT scan   . Cough 07/21/2010  . Dysmenorrhea 01/31/2016  . Eczema   . Headache    migraine  . History of kidney stones   . Hyperlipidemia   . Insulin-resistant diabetes mellitus and acanthosis nigricans    diet controlled does not do CBGs  . Left ureteral calculus   . Nausea & vomiting   . Upper respiratory infection   . Ureter, calculus 11/05/2011  . URI (upper respiratory infection) 03/10/2011   Past Surgical History:  Procedure Laterality Date  . BACK SURGERY  02/02/14   L5- S1 discectomy  . CESAREAN SECTION  2002 &  2005   x2  .  CHOLECYSTECTOMY N/A 10/06/2014   Procedure: LAPAROSCOPIC CHOLECYSTECTOMY WITH INTRAOPERATIVE CHOLANGIOGRAM;  Surgeon: Autumn Messing III, MD;  Location: [place];  Service: General;  Laterality: N/A;  . CYSTO/ RIGHT RETROGRADE PYELOGRAM/ RIGHT URETERAL STENT PLACEMENT  10-28-2010   RIGHT RENAL STONE  . CYSTOSCOPY WITH URETEROSCOPY  11/05/2011   Procedure: CYSTOSCOPY WITH URETEROSCOPY;  Surgeon: Bernestine Amass, MD;  Location[facility];  Service: Urology;  Laterality: Left;  URETEROSCOPY , STONE EXTRACTION POSSIBLE  BASKET RETROGRADE AND HOLMIUM LASER AND POSSIBLE STENT   C ARM  LASER   . DILITATION & CURRETTAGE/HYSTROSCOPY WITH NOVASURE ABLATION N/A 09/09/2013   Procedure: DILATATION & CURETTAGE/HYSTEROSCOPY WITH NOVASURE ABLATION;  Surgeon: Lovenia Kim, MD;  Location: [facility];  Service: Gynecology;  Laterality: N/A;  . ROBOTIC ASSISTED TOTAL HYSTERECTOMY WITH SALPINGECTOMY Bilateral 01/31/2016   Procedure: ROBOTIC ASSISTED TOTAL HYSTERECTOMY WITH SALPINGECTOMY;  Surgeon: Brien Few, MD;  Location: [facility];  Service: Gynecology;  Laterality: Bilateral;     Current Meds  Medication Sig  . albuterol (PROVENTIL HFA;VENTOLIN HFA) 108 (90 BASE) MCG/ACT inhaler Inhale 1-2 puffs into the lungs every 6 (six) hours as needed for wheezing or shortness of breath.   Marland Kitchen atorvastatin (LIPITOR) 40 MG tablet Take 1 tablet (40 mg total) by mouth daily. Please keep upcoming appt in August with Dr. Radford Pax for future refills. Thank you  . budesonide-formoterol (SYMBICORT) 80-4.5 MCG/ACT inhaler Inhale 2 puffs into the lungs 2 (two) times daily.  . cetirizine (ZYRTEC) 10 MG tablet Take 10 mg by mouth daily.  . Cholecalciferol (VITAMIN D) 50 MCG (2000 UT) tablet Take 2,000 Units by mouth daily.  . fluticasone (FLONASE) 50 MCG/ACT nasal spray as directed.  . furosemide (LASIX) 20 MG tablet Take 1 tablet (20 mg total) by mouth as needed for edema.  . Naproxen (NAPROSYN PO) Take 1 capsule by mouth as directed.   . pantoprazole (PROTONIX) 40 MG tablet Take 40 mg by mouth as directed.     Allergies:   Patient has no known allergies.   Social History   Tobacco Use  . Smoking status: Never Smoker  . Smokeless tobacco: Never Used  Substance Use Topics  . Alcohol use: No    Alcohol/week: 3.0 standard drinks    Types: 2 Glasses of wine, 1 Standard drinks or equivalent per week    Comment: ocassional  . Drug use: No     Family Hx: The patient's family history includes Atrial fibrillation in her mother; Heart disease  in her mother; Mitral valve prolapse in her mother.  ROS:   Please see the history of present illness.     All other systems reviewed and are negative.   Labs/Other Tests and Data Reviewed:    Recent Labs: No results found for requested labs within last 8760 hours.   Recent Lipid Panel No results found for: CHOL, TRIG, HDL, CHOLHDL, LDLCALC, LDLDIRECT  Wt Readings from Last 3 Encounters:  09/27/18 254 lb (115.2 kg)  06/18/17 270 lb (122.5 kg)  06/02/17 270 lb 8 oz (122.7 kg)     Objective:    Vital Signs:  Pulse 90   Ht 5\' 4"  (1.626 m)   Wt 254 lb (115.2 kg)   LMP 01/24/2016 (Exact Date)   BMI 43.60 kg/m     ASSESSMENT & PLAN:    1.  Coronary artery calcifications - calcium score on CT was 5 which is low.  Nuclear stress test showed no ischemia.  She denies any anginal sx.  She needs to continue with aggressive risk factor modification.  Last HbA1C was 5.4 a year ago and LDL 108.  I will repeat an FLP and ALT as well as HbA1C.    2.  Hyperlipidemia - her LDL goal is < 70 due to coronary Ca.  I will repeat an FLP and ALT.  Continue on Atorvastatin 40mg  daily.    3.  Type 2 DM - followed by PCP.   -CHeck HbA1C -currently diet controlled  4.  Morbid obesity - she walks on the treadmill 45 min daily and has lost 25lbs.  I congratulated her on her progress.  5.  LE edema - she still has problems with intermittent LE and hand edema which is controlled with diuretics. Continue on Lasix PRN.  COVID-19 Education: The signs and symptoms of COVID-19 were discussed with the patient and how to seek care for testing (follow up with PCP or arrange E-visit).  The importance of social distancing was discussed today.  Patient Risk:   After full review of this patient's clinical status, I feel that they are at least moderate risk at this time.  Time:   Today, I have spent 20 minutes on telemedicine discussing medical problems including coronary artery calcifications,  hyperlipidemia, DM.  We also reviewed the symptoms of COVID 19 and the ways to protect against contracting the virus with telehealth technology.  I spent an additional 5 minutes reviewing patient's chart including labs.  Medication Adjustments/Labs and Tests Ordered: Current medicines are reviewed at length with the patient today.  Concerns regarding medicines are outlined above.  Tests Ordered: No orders of the defined types were placed in this encounter.  Medication Changes: No orders of the defined types were placed in this encounter.   Disposition:  Follow up in 1 year(s)  Signed, Fransico Him, MD  09/27/2018 9:16 AM    [facility]

## 2018-09-27 ENCOUNTER — Telehealth (INDEPENDENT_AMBULATORY_CARE_PROVIDER_SITE_OTHER): Payer: Self-pay | Admitting: Cardiology

## 2018-09-27 ENCOUNTER — Telehealth: Payer: Self-pay | Admitting: *Deleted

## 2018-09-27 ENCOUNTER — Other Ambulatory Visit: Payer: Self-pay

## 2018-09-27 VITALS — HR 90 | Ht 64.0 in | Wt 254.0 lb

## 2018-09-27 DIAGNOSIS — E7800 Pure hypercholesterolemia, unspecified: Secondary | ICD-10-CM

## 2018-09-27 DIAGNOSIS — E1122 Type 2 diabetes mellitus with diabetic chronic kidney disease: Secondary | ICD-10-CM

## 2018-09-27 DIAGNOSIS — I2510 Atherosclerotic heart disease of native coronary artery without angina pectoris: Secondary | ICD-10-CM

## 2018-09-27 DIAGNOSIS — N181 Chronic kidney disease, stage 1: Secondary | ICD-10-CM

## 2018-09-27 MED ORDER — ATORVASTATIN CALCIUM 40 MG PO TABS: 40.0000 mg | ORAL_TABLET | Freq: Every day | ORAL | 3 refills | Status: DC

## 2018-09-27 NOTE — Telephone Encounter (Signed)
Pt had telemedicine visit today with Dr. Radford Pax.  Needs lab work scheduled. I placed call to pt to schedule lab appt.  Left message to call office.

## 2018-09-27 NOTE — Telephone Encounter (Signed)
Lab work has been scheduled for August 25,2020

## 2018-09-27 NOTE — Patient Instructions (Signed)
Medication Instructions:  Your physician recommends that you continue on your current medications as directed. Please refer to the Current Medication list given to you today.  If you need a refill on your cardiac medications before your next appointment, please call your pharmacy.   Lab work: Your physician recommends that you return for lab work.  Please call our office to schedule appointment for lab work. --This will be fasting.  HgbA1C, BMP, Lipid profile and ALT  If you have labs (blood work) drawn today and your tests are completely normal, you will receive your results only by: Marland Kitchen MyChart Message (if you have MyChart) OR . A paper copy in the mail If you have any lab test that is abnormal or we need to change your treatment, we will call you to review the results.  Testing/Procedures: none  Follow-Up: At [facility], you and your health needs are our priority.  As part of our continuing mission to provide you with exceptional heart care, we have created designated Provider Care Teams.  These Care Teams include your primary Cardiologist (physician) and Advanced Practice Providers (APPs -  Physician Assistants and Nurse Practitioners) who all work together to provide you with the care you need, when you need it. You will need a follow up appointment in 12 months.  Please call our office 2 months in advance to schedule this appointment.  You may see Dr. Radford Pax or one of the following Advanced Practice Providers on your designated Care Team:   [place], PA-C Melina Copa, PA-C . Ermalinda Barrios, PA-C  Any Other Special Instructions Will Be Listed Below (If Applicable).

## 2018-09-28 ENCOUNTER — Other Ambulatory Visit: Payer: Self-pay

## 2019-04-09 ENCOUNTER — Ambulatory Visit: Payer: Self-pay | Attending: Internal Medicine

## 2019-04-09 DIAGNOSIS — Z23 Encounter for immunization: Secondary | ICD-10-CM | POA: Insufficient documentation

## 2019-04-09 NOTE — Progress Notes (Signed)
   Covid-19 Vaccination Clinic  Name:  Tammy Velez    MRN: [id number] DOB: [birth year]  04/09/2019  [name] was observed post Covid-19 immunization for 15 minutes without incident. She was provided with Vaccine Information Sheet and instruction to access the V-Safe system.   [name] was instructed to call 911 with any severe reactions post vaccine: Marland Kitchen Difficulty breathing  . Swelling of face and throat  . A fast heartbeat  . A bad rash all over body  . Dizziness and weakness   Immunizations Administered    Name Date Dose VIS Date Route   Pfizer COVID-19 Vaccine 04/09/2019  6:01 PM 0.3 mL 01/14/2019 Intramuscular   Manufacturer: [place]   Lot: [id number]   [place]: [id number]

## 2019-04-30 ENCOUNTER — Ambulatory Visit: Payer: Self-pay | Attending: Internal Medicine

## 2019-04-30 DIAGNOSIS — Z23 Encounter for immunization: Secondary | ICD-10-CM

## 2019-04-30 NOTE — Progress Notes (Signed)
   Covid-19 Vaccination Clinic  Name:  Tammy Velez    MRN: [id number] DOB: 06/01/1965  04/30/2019  [name] was observed post Covid-19 immunization for 15 minutes without incident. She was provided with Vaccine Information Sheet and instruction to access the V-Safe system.   [name] was instructed to call 911 with any severe reactions post vaccine: Marland Kitchen Difficulty breathing  . Swelling of face and throat  . A fast heartbeat  . A bad rash all over body  . Dizziness and weakness   Immunizations Administered    Name Date Dose VIS Date Route   Pfizer COVID-19 Vaccine 04/30/2019  1:32 PM 0.3 mL 01/14/2019 Intramuscular   Manufacturer: [place]   Lot: [id number]   [place]: [id number]

## 2019-05-11 ENCOUNTER — Ambulatory Visit: Payer: Self-pay

## 2019-10-13 ENCOUNTER — Other Ambulatory Visit: Payer: Self-pay | Admitting: Cardiology

## 2019-11-16 ENCOUNTER — Other Ambulatory Visit: Payer: Self-pay | Admitting: Cardiology

## 2019-12-08 ENCOUNTER — Other Ambulatory Visit: Payer: Self-pay | Admitting: Cardiology

## 2020-01-12 ENCOUNTER — Other Ambulatory Visit: Payer: Self-pay | Admitting: Cardiology

## 2020-03-22 ENCOUNTER — Telehealth: Payer: Self-pay | Admitting: Cardiology

## 2020-03-22 MED ORDER — ATORVASTATIN CALCIUM 40 MG PO TABS: ORAL_TABLET | ORAL | 0 refills | Status: DC

## 2020-03-22 NOTE — Telephone Encounter (Signed)
   *  STAT* If patient is at the pharmacy, call can be transferred to refill team.   1. Which medications need to be refilled? (please list name of each medication and dose if known)   atorvastatin (LIPITOR) 40 MG tablet    2. Which pharmacy/location (including street and city if local pharmacy) is medication to be sent to? [facility] [address] [place] [address]  3. Do they need a 30 day or 90 day supply? 90 days

## 2020-03-22 NOTE — Telephone Encounter (Signed)
Pt's medication was sent to pt's pharmacy as requested. Confirmation received.  °

## 2020-04-10 ENCOUNTER — Ambulatory Visit: Payer: Self-pay | Admitting: Cardiology

## 2020-04-19 ENCOUNTER — Other Ambulatory Visit: Payer: Self-pay | Admitting: Cardiology

## 2020-05-08 ENCOUNTER — Ambulatory Visit (INDEPENDENT_AMBULATORY_CARE_PROVIDER_SITE_OTHER): Payer: Managed Care, Other (non HMO) | Admitting: Cardiology

## 2020-05-08 ENCOUNTER — Other Ambulatory Visit: Payer: Self-pay

## 2020-05-08 ENCOUNTER — Encounter: Payer: Self-pay | Admitting: Cardiology

## 2020-05-08 VITALS — BP 132/74 | HR 102 | Ht 64.0 in | Wt 267.0 lb

## 2020-05-08 DIAGNOSIS — E1122 Type 2 diabetes mellitus with diabetic chronic kidney disease: Secondary | ICD-10-CM

## 2020-05-08 DIAGNOSIS — R600 Localized edema: Secondary | ICD-10-CM | POA: Diagnosis not present

## 2020-05-08 DIAGNOSIS — I2510 Atherosclerotic heart disease of native coronary artery without angina pectoris: Secondary | ICD-10-CM | POA: Diagnosis not present

## 2020-05-08 DIAGNOSIS — E7800 Pure hypercholesterolemia, unspecified: Secondary | ICD-10-CM

## 2020-05-08 DIAGNOSIS — N181 Chronic kidney disease, stage 1: Secondary | ICD-10-CM

## 2020-05-08 MED ORDER — ATORVASTATIN CALCIUM 40 MG PO TABS: 40.0000 mg | ORAL_TABLET | Freq: Every day | ORAL | 0 refills | Status: DC

## 2020-05-08 NOTE — Patient Instructions (Addendum)
Medication Instructions:  Your physician recommends that you continue on your current medications as directed. Please refer to the Current Medication list given to you today.  *If you need a refill on your cardiac medications before your next appointment, please call your pharmacy*   Lab Work: Fasting lipids, ALT, and HgBA1C  If you have labs (blood work) drawn today and your tests are completely normal, you will receive your results only by: Marland Kitchen MyChart Message (if you have MyChart) OR . A paper copy in the mail If you have any lab test that is abnormal or we need to change your treatment, we will call you to review the results.   Follow-Up: At [facility], you and your health needs are our priority.  As part of our continuing mission to provide you with exceptional heart care, we have created designated Provider Care Teams.  These Care Teams include your primary Cardiologist (physician) and Advanced Practice Providers (APPs -  Physician Assistants and Nurse Practitioners) who all work together to provide you with the care you need, when you need it.  Your next appointment:   1 year(s)  The format for your next appointment:   In Person  Provider:   You may see Fransico Him, MD or one of the following Advanced Practice Providers on your designated Care Team:    Melina Copa, PA-C  Ermalinda Barrios, PA-C

## 2020-05-08 NOTE — Addendum Note (Signed)
Addended by: Antonieta Iba on: 05/08/2020 03:15 PM   Modules accepted: Orders

## 2020-05-08 NOTE — Progress Notes (Signed)
[facility].  Date:  05/08/2020   ID:  Tammy Velez, DOB Nov 06, 1965, MRN [id number]   PCP:  Lennie Odor, PA  Cardiologist:  Fransico Him, MD Electrophysiologist:  None   Chief Complaint:  Coronary artery calcifications  History of Present Illness:    Tammy Velez is a 55 y.o. female with a history of type 2 DM, HLD and LE edema as well as asthma and cough who was found on Chest CT to have one-vessel coronary atherosclerosis of the LAD. Coronary Ca score was done which was low at 5.  Nuclear stress test showed no ischemia.    She is here today for followup and is doing well.  She denies any chest pain or pressure, SOB, DOE, PND, orthopnea,  dizziness, palpitations or syncope. Occasionally she will have some mild LE edema. She is compliant with her meds and is tolerating meds with no SE.     Prior CV studies:   The following studies were reviewed today:  none  Past Medical History:  Diagnosis Date  . Asthma    Allegery induced  . Complication of anesthesia 11/05/11    Pt re-intubated in OR due to residual muscle weakness  . Constipation   . Coronary artery calcification seen on CAT scan   . Cough 07/21/2010  . Dysmenorrhea 01/31/2016  . Eczema   . Headache    migraine  . History of kidney stones   . Hyperlipidemia   . Insulin-resistant diabetes mellitus and acanthosis nigricans    diet controlled does not do CBGs  . Left ureteral calculus   . Nausea & vomiting   . Upper respiratory infection   . Ureter, calculus 11/05/2011  . URI (upper respiratory infection) 03/10/2011   Past Surgical History:  Procedure Laterality Date  . BACK SURGERY  02/02/14   L5- S1 discectomy  . CESAREAN SECTION  2002 &  2005   x2  . CHOLECYSTECTOMY N/A 10/06/2014   Procedure: LAPAROSCOPIC CHOLECYSTECTOMY WITH INTRAOPERATIVE CHOLANGIOGRAM;  Surgeon: Autumn Messing III, MD;  Location: [place];  Service: General;  Laterality: N/A;  . CYSTO/ RIGHT RETROGRADE PYELOGRAM/ RIGHT URETERAL STENT  PLACEMENT  10-28-2010   RIGHT RENAL STONE  . CYSTOSCOPY WITH URETEROSCOPY  11/05/2011   Procedure: CYSTOSCOPY WITH URETEROSCOPY;  Surgeon: Bernestine Amass, MD;  Location: [facility];  Service: Urology;  Laterality: Left;  URETEROSCOPY , STONE EXTRACTION POSSIBLE BASKET RETROGRADE AND HOLMIUM LASER AND POSSIBLE STENT   C ARM  LASER   . DILITATION & CURRETTAGE/HYSTROSCOPY WITH NOVASURE ABLATION N/A 09/09/2013   Procedure: DILATATION & CURETTAGE/HYSTEROSCOPY WITH NOVASURE ABLATION;  Surgeon: Lovenia Kim, MD;  Location: [facility];  Service: Gynecology;  Laterality: N/A;  . ROBOTIC ASSISTED TOTAL HYSTERECTOMY WITH SALPINGECTOMY Bilateral 01/31/2016   Procedure: ROBOTIC ASSISTED TOTAL HYSTERECTOMY WITH SALPINGECTOMY;  Surgeon: Brien Few, MD;  Location: [facility];  Service: Gynecology;  Laterality: Bilateral;     Current Meds  Medication Sig  . albuterol (PROVENTIL HFA;VENTOLIN HFA) 108 (90 BASE) MCG/ACT inhaler Inhale 1-2 puffs into the lungs every 6 (six) hours as needed for wheezing or shortness of breath.   Marland Kitchen atorvastatin (LIPITOR) 40 MG tablet TAKE ONE TABLET BY MOUTH DAILY *PLEASE KEEP APPOINTMENT FOR UPCOMING APPOINTMENT WITH DR. Radford Pax IN APRIL 2022 BEFORE ANYMORE REFILLS, FINAL ATTEMPT*  . escitalopram (LEXAPRO) 10 MG tablet Take 10 mg by mouth daily.  . furosemide (LASIX) 20 MG tablet Take 1 tablet (20 mg total) by mouth as needed for edema.  . Naproxen (  NAPROSYN PO) Take 1 capsule by mouth as directed.   . pantoprazole (PROTONIX) 40 MG tablet Take 40 mg by mouth as directed.     Allergies:   Patient has no known allergies.   Social History   Tobacco Use  . Smoking status: Never Smoker  . Smokeless tobacco: Never Used  Substance Use Topics  . Alcohol use: No    Alcohol/week: 3.0 standard drinks    Types: 2 Glasses of wine, 1 Standard drinks or equivalent per week    Comment: ocassional  . Drug use: No     Family Hx: The patient's family history includes  Atrial fibrillation in her mother; Heart disease in her mother; Mitral valve prolapse in her mother.  ROS:   Please see the history of present illness.     All other systems reviewed and are negative.   Labs/Other Tests and Data Reviewed:    Recent Labs: No results found for requested labs within last 8760 hours.   Recent Lipid Panel No results found for: CHOL, TRIG, HDL, CHOLHDL, LDLCALC, LDLDIRECT  Wt Readings from Last 3 Encounters:  05/08/20 267 lb (121.1 kg)  09/27/18 254 lb (115.2 kg)  06/18/17 270 lb (122.5 kg)     Objective:    Vital Signs:  BP 132/74   Pulse (!) 102   Ht 5\' 4"  (1.626 m)   Wt 267 lb (121.1 kg)   LMP 01/24/2016 (Exact Date)   SpO2 93%   BMI 45.83 kg/m   GEN: Well nourished, well developed in no acute distress HEENT: Normal NECK: No JVD; No carotid bruits LYMPHATICS: No lymphadenopathy CARDIAC:RRR, no murmurs, rubs, gallops RESPIRATORY:  Clear to auscultation without rales, wheezing or rhonchi  ABDOMEN: Soft, non-tender, non-distended MUSCULOSKELETAL:  No edema; No deformity  SKIN: Warm and dry NEUROLOGIC:  Alert and oriented x 3 PSYCHIATRIC:  Normal affect   EKG was performed in the office today and showed NSR with LAFB  ASSESSMENT & PLAN:    1.  Coronary artery calcifications  - calcium score on CT was 5 which is low.   -Nuclear stress test showed no ischemia.   -she has not had any anginal symptoms -She needs to continue with aggressive risk factor modification.    2.  Hyperlipidemia  - her LDL goal is < 70 due to coronary Ca.  - repeat FLP and ALT - Continue on Atorvastatin 40mg  daily.    3.  Type 2 DM - followed by PCP.   -HbA1C was 5.4% in 2020 -repeat this today -currently diet controlled  4.  LE edema - she still has problems with intermittent LE and hand edema which is controlled with diuretics.  -Continue on Lasix PRN.  5.  Tachycardia -likely anxiety related -I asked her to check her HR a few times this week and  send to me on my chart   Medication Adjustments/Labs and Tests Ordered: Current medicines are reviewed at length with the patient today.  Concerns regarding medicines are outlined above.  Tests Ordered: Orders Placed This Encounter  Procedures  . EKG 12-Lead   Medication Changes: No orders of the defined types were placed in this encounter.   Disposition:  Follow up in 1 year(s)  Signed, Fransico Him, MD  05/08/2020 3:10 PM    [facility]

## 2020-05-11 ENCOUNTER — Other Ambulatory Visit: Payer: Managed Care, Other (non HMO)

## 2020-05-24 ENCOUNTER — Other Ambulatory Visit: Payer: Self-pay | Admitting: Cardiology
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
## 2020-11-26 DIAGNOSIS — H04123 Dry eye syndrome of bilateral lacrimal glands: Secondary | ICD-10-CM | POA: Diagnosis not present

## 2021-01-07 DIAGNOSIS — K649 Unspecified hemorrhoids: Secondary | ICD-10-CM | POA: Diagnosis not present

## 2021-02-21 DIAGNOSIS — R739 Hyperglycemia, unspecified: Secondary | ICD-10-CM | POA: Diagnosis not present

## 2021-02-21 DIAGNOSIS — K589 Irritable bowel syndrome without diarrhea: Secondary | ICD-10-CM | POA: Diagnosis not present

## 2021-02-21 DIAGNOSIS — J45909 Unspecified asthma, uncomplicated: Secondary | ICD-10-CM | POA: Diagnosis not present

## 2021-02-21 DIAGNOSIS — Z0000 Encounter for general adult medical examination without abnormal findings: Secondary | ICD-10-CM | POA: Diagnosis not present

## 2021-02-21 DIAGNOSIS — E7800 Pure hypercholesterolemia, unspecified: Secondary | ICD-10-CM | POA: Diagnosis not present

## 2021-02-22 DIAGNOSIS — R739 Hyperglycemia, unspecified: Secondary | ICD-10-CM | POA: Diagnosis not present

## 2021-02-22 DIAGNOSIS — E7800 Pure hypercholesterolemia, unspecified: Secondary | ICD-10-CM | POA: Diagnosis not present

## 2021-03-13 DIAGNOSIS — R635 Abnormal weight gain: Secondary | ICD-10-CM | POA: Diagnosis not present

## 2021-06-09 ENCOUNTER — Other Ambulatory Visit: Payer: Self-pay | Admitting: Cardiology

## 2021-07-08 ENCOUNTER — Other Ambulatory Visit: Payer: Self-pay | Admitting: Cardiology

## 2021-09-05 ENCOUNTER — Other Ambulatory Visit: Payer: Self-pay | Admitting: Cardiology

## 2021-09-28 ENCOUNTER — Other Ambulatory Visit: Payer: Self-pay | Admitting: Cardiology

## 2022-02-28 ENCOUNTER — Other Ambulatory Visit: Payer: Self-pay | Admitting: Physician Assistant

## 2022-02-28 DIAGNOSIS — R519 Headache, unspecified: Secondary | ICD-10-CM

## 2022-03-15 ENCOUNTER — Ambulatory Visit
Admission: RE | Admit: 2022-03-15 | Discharge: 2022-03-15 | Disposition: A | Discharge status: Home / Self Care | Payer: BC Managed Care – PPO | Source: Ambulatory Visit | Attending: Physician Assistant | Admitting: Physician Assistant

## 2022-03-15 DIAGNOSIS — R519 Headache, unspecified: Secondary | ICD-10-CM

## 2022-03-18 ENCOUNTER — Encounter: Payer: Self-pay | Admitting: Neurology

## 2022-03-24 NOTE — Progress Notes (Signed)
Initial neurology clinic note  [name] MRN: [id number] DOB: 06/30/1965  Referring provider: Lennie Odor, PA  Primary care provider: Lennie Odor, PA  Reason for consult:  headache  Subjective:  This is [name], a 57 y.o. right-handed female with a medical history of HLD, IBS, asthma, hypothyroidism, kidney stones who presents to neurology clinic with headache. The patient is accompanied by husband.  Patient started having "migraines" when going through menopause (~2017). They were located on the right side, with pressure behind the eyes, with photophobia and phonophobia, and dizziness when standing. She denies nausea or vomiting. She would have maybe 1 per month. Over the last year, they were more frequent, about 1 headache every 1-2 weeks.   Over the last 6 months, they also became more intense and frequent. She also noticed post-orgasmic headache with a sharp pain on the right side of head that would last 20-30 seconds followed by an intense typically migraine. They now happen even without orgasm. She can have word finding problems, blurry vision. She does not have clear vision loss, but does think she requires more light to see than previous.  She denies any headache aura.  She prefers to lay in a cold, dark, room when she gets a headache. She denies worsening of symptoms with position (stay the same when she lays down). Her headaches do not get worse with baring down (such as with bowel movements). She denies jaw claudication or loss of vision.  She takes ibuprofen daily for headaches. She has never been on headache medications.  Patient has a family history of cerebral aneurysm. Paternal grandfather died from brain aneurysm in his 62s.  MRI brain on 03/18/22 showed a partially empty sella. 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  Patient takes pellets testosterone and estrogen.   She drinks 1 regular cup of coffee  daily. She will occasionally drink a soda. She drinks 2 cocktails per week. She does not smoke.  She takes vit D and a probiotic.  She saw her eye doctor about 1 year ago and is planning to go again soon.   MEDICATIONS:  Outpatient Encounter Medications as of 04/02/2022  Medication Sig   albuterol (PROVENTIL HFA;VENTOLIN HFA) 108 (90 BASE) MCG/ACT inhaler Inhale 1-2 puffs into the lungs every 6 (six) hours as needed for wheezing or shortness of breath.    atorvastatin (LIPITOR) 40 MG tablet Take 1 tablet (40 mg total) by mouth daily. Please call our office to schedule an overdue appointment with Dr. Radford Pax before anymore refills. [phone]. Thank you 2nd attempt (Patient taking differently: Take 20 mg by mouth daily. Please call our office to schedule an overdue appointment with Dr. Radford Pax before anymore refills. [phone]. Thank you 2nd attempt)   furosemide (LASIX) 20 MG tablet Take 1 tablet (20 mg total) by mouth as needed for edema.   Naproxen (NAPROSYN PO) Take 1 capsule by mouth 3 (three) times daily as needed.   Testosterone 100 MG PLLT Take 100 mg by mouth See admin instructions.   Testosterone 50 MG PLLT Take 50 mg by mouth See admin instructions.   escitalopram (LEXAPRO) 10 MG tablet Take 10 mg by mouth daily.   pantoprazole (PROTONIX) 40 MG tablet Take 40 mg by mouth as directed.   No facility-administered encounter medications on file as of 04/02/2022.    PAST MEDICAL HISTORY: Past Medical History:  Diagnosis Date   Asthma    Allegery induced   Complication of  anesthesia 11/05/11    Pt re-intubated in OR due to residual muscle weakness   Constipation    Coronary artery calcification seen on CAT scan    Cough 07/21/2010   Dysmenorrhea 01/31/2016   Eczema    Headache    migraine   History of kidney stones    Hyperlipidemia    Insulin-resistant diabetes mellitus and acanthosis nigricans    diet controlled does not do CBGs   Left ureteral calculus    Nausea &  vomiting    Upper respiratory infection    Ureter, calculus 11/05/2011   URI (upper respiratory infection) 03/10/2011    PAST SURGICAL HISTORY: Past Surgical History:  Procedure Laterality Date   BACK SURGERY  02/02/14   L5- S1 discectomy   CESAREAN SECTION  2002 &  2005   x2   CHOLECYSTECTOMY N/A 10/06/2014   Procedure: LAPAROSCOPIC CHOLECYSTECTOMY WITH INTRAOPERATIVE CHOLANGIOGRAM;  Surgeon: Autumn Messing III, MD;  Location: [facility];  Service: General;  Laterality: N/A;   CYSTO/ RIGHT RETROGRADE PYELOGRAM/ RIGHT URETERAL STENT PLACEMENT  10-28-2010   RIGHT RENAL STONE   CYSTOSCOPY WITH URETEROSCOPY  11/05/2011   Procedure: CYSTOSCOPY WITH URETEROSCOPY;  Surgeon: Bernestine Amass, MD;  Location: [facility];  Service: Urology;  Laterality: Left;  URETEROSCOPY , STONE EXTRACTION POSSIBLE BASKET RETROGRADE AND HOLMIUM LASER AND POSSIBLE STENT   C ARM  LASER    DILITATION & CURRETTAGE/HYSTROSCOPY WITH NOVASURE ABLATION N/A 09/09/2013   Procedure: DILATATION & CURETTAGE/HYSTEROSCOPY WITH NOVASURE ABLATION;  Surgeon: Lovenia Kim, MD;  Location: [facility];  Service: Gynecology;  Laterality: N/A;   ROBOTIC ASSISTED TOTAL HYSTERECTOMY WITH SALPINGECTOMY Bilateral 01/31/2016   Procedure: ROBOTIC ASSISTED TOTAL HYSTERECTOMY WITH SALPINGECTOMY;  Surgeon: Brien Few, MD;  Location: [facility];  Service: Gynecology;  Laterality: Bilateral;    ALLERGIES: No Known Allergies  FAMILY HISTORY: Family History  Problem Relation Age of Onset   Heart disease Mother    Atrial fibrillation Mother    Mitral valve prolapse Mother     SOCIAL HISTORY: Social History   Tobacco Use   Smoking status: Never   Smokeless tobacco: Never  Vaping Use   Vaping Use: Never used  Substance Use Topics   Alcohol use: No    Alcohol/week: 3.0 standard drinks of alcohol    Types: 2 Glasses of wine, 1 Standard drinks or equivalent per week    Comment: ocassional   Drug use: No   Social History   Social  History Narrative   Are you right handed or left handed? Right   Are you currently employed ? 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   Who lives with you? Husband and 2 children   What type of home do you live in: 1 story or 2 story? two   Caffeine 1 cup     Objective:  Vital Signs:  BP 130/74   Pulse 89   Ht '5\' 4"'$  (1.626 m)   Wt 270 lb (122.5 kg)   LMP 01/24/2016 (Exact Date)   SpO2 94%   BMI 46.35 kg/m   General: No acute distress.  Patient appears well-groomed.   Head:  Normocephalic/atraumatic Eyes:  fundi examined but not visualized Neck: supple, no paraspinal tenderness, full range of motion  Neurological Exam: Mental status: alert and oriented, speech fluent and not dysarthric, language intact.  Cranial nerves: CN I: not tested CN II: pupils equal, round and reactive to  light, visual fields intact CN III, IV, VI:  full range of motion, no nystagmus, no ptosis CN V: facial sensation intact. CN VII: upper and lower face symmetric CN VIII: hearing intact CN IX, X: gag intact, uvula midline CN XI: sternocleidomastoid and trapezius muscles intact CN XII: tongue midline  Bulk & Tone: normal, no fasciculations. Motor:  muscle strength 5/5 throughout Deep Tendon Reflexes:  2+ throughout   Sensation:  Temperature  sensation intact in all extremities. Finger to nose testing:  Without dysmetria.  Gait:  Normal station and stride.  Romberg negative.   Labs and Imaging review: Internal labs: Lab Results  Component Value Date   HGBA1C 5.4 06/02/2017    Lab Results  Component Value Date   TSH 2.500 06/02/2017    External labs: CMP (02/25/22): unremarkable HbA1c (02/22/21): 5.6 TSH( 03/2022): 1.687  Imaging: MRI/MRA brain wo contrast (03/15/22): FINDINGS: MRI HEAD FINDINGS   Brain: There is no evidence of an acute infarct, intracranial hemorrhage, mass, midline shift, or extra-axial fluid collection. The ventricles and  sulci are normal. No significant white matter disease is evident. An incidental developmental venous anomaly is noted in the right basal ganglia. There is a partially empty sella. The cerebellar tonsils are normally positioned.   Vascular: Major intracranial vascular flow voids are preserved.   Skull and upper cervical spine: Unremarkable bone marrow signal.   Sinuses/Orbits: Unremarkable orbits. No significant inflammatory changes in the paranasal sinuses. Small right mastoid effusion.   Other: None.   MRA HEAD FINDINGS   Anterior circulation: The internal carotid arteries are widely patent from skull base to carotid termini. There is a 2 mm protrusion from the right supraclinoid ICA in the posterior communicating region. The ACAs and MCAs are patent without evidence of a proximal branch occlusion or significant proximal stenosis.   Posterior circulation: The included distal portions of the intracranial vertebral arteries are patent to the basilar. The basilar artery is widely patent. Patent SCA origins are seen bilaterally. There may be diminutive posterior communicating arteries bilaterally. Both PCAs are patent without evidence of a significant proximal stenosis. No aneurysm is identified.   Anatomic variants: None.   IMPRESSION: 1. Partially empty sella, often an incidental finding though can be seen with intracranial hypertension. 2. Otherwise unremarkable appearance of the brain. 3. 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 4. Otherwise unremarkable head MRA.   Assessment/Plan:  NEREA BETTINGER is a 57 y.o. female who presents for evaluation of headaches. She has a relevant medical history of HLD, IBS, asthma, hypothyroidism, kidney stones. Her neurological examination is essentially normal today. I was not able to visualize her fundus to evaluate for papilledema. 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 Patient's symptoms are not consistent with increased intracranial pressure (such as IIH) and aneurysm is small and not likely to be causing symptoms, though I will monitor it. 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 She has never previously been on treatment. I will work up and treat as below.  PLAN: -Blood work: B12, ESR -Recommend patient see her ophthalmologist for dilated exam to rule out papilledema -Repeat vessel imaging in 6-12 months -No contraindication to continue hormone replacement, but did discuss risks and benefits including potential increased risk of blood clots and stroke -For headaches: Migraine prevention:  nortriptyline 10 mg qhs for 1 week, then increase 20 mg  qhs Migraine rescue:  sumatriptan 100 mg PRN Limit use of pain relievers to no more than 2 days out of week to prevent risk of rebound or medication-overuse headache. Keep headache diary   -Return to clinic 1-2 months  The impression above as well as the plan as outlined below were extensively discussed with the patient (in the company of husband) who voiced understanding. All questions were answered to their satisfaction.  When available, results of the above investigations and possible further recommendations will be communicated to the patient via telephone/MyChart. Patient to call office if not contacted after expected testing turnaround time.   Total time spent reviewing records, interview, history/exam, documentation, and coordination of care on day of encounter:  60 min   Thank you for allowing me to participate in patient's care.  If I can answer any additional questions, I would be pleased to do so.  Kai Levins, MD   CC: Red[place], [place], [place] [address] [place] [address]  CC: Referring provider: [place], [place], [place] [address] [place],  [place] [address]

## 2022-04-02 ENCOUNTER — Other Ambulatory Visit (INDEPENDENT_AMBULATORY_CARE_PROVIDER_SITE_OTHER): Payer: BC Managed Care – PPO

## 2022-04-02 ENCOUNTER — Encounter: Payer: Self-pay | Admitting: Neurology

## 2022-04-02 ENCOUNTER — Ambulatory Visit: Payer: BC Managed Care – PPO | Admitting: Neurology

## 2022-04-02 VITALS — BP 130/74 | HR 89 | Ht 64.0 in | Wt 270.0 lb

## 2022-04-02 DIAGNOSIS — G43009 Migraine without aura, not intractable, without status migrainosus: Secondary | ICD-10-CM

## 2022-04-02 DIAGNOSIS — I671 Cerebral aneurysm, nonruptured: Secondary | ICD-10-CM

## 2022-04-02 DIAGNOSIS — H53149 Visual discomfort, unspecified: Secondary | ICD-10-CM

## 2022-04-02 DIAGNOSIS — H538 Other visual disturbances: Secondary | ICD-10-CM

## 2022-04-02 DIAGNOSIS — F40298 Other specified phobia: Secondary | ICD-10-CM | POA: Diagnosis not present

## 2022-04-02 DIAGNOSIS — Z8249 Family history of ischemic heart disease and other diseases of the circulatory system: Secondary | ICD-10-CM

## 2022-04-02 LAB — SEDIMENTATION RATE: Sed Rate: 52 mm/hr — ABNORMAL HIGH (ref 0–30)

## 2022-04-02 LAB — VITAMIN B12: Vitamin B-12: 256 pg/mL (ref 211–911)

## 2022-04-02 MED ORDER — SUMATRIPTAN SUCCINATE 100 MG PO TABS: 100.0000 mg | ORAL_TABLET | Freq: Once | ORAL | 2 refills | Status: DC | PRN

## 2022-04-02 MED ORDER — NORTRIPTYLINE HCL 10 MG PO CAPS: 20.0000 mg | ORAL_CAPSULE | Freq: Every day | ORAL | 5 refills | Status: DC

## 2022-04-02 NOTE — Patient Instructions (Addendum)
I saw you today for headaches. They sound most consistent with migraines.  I would like to get blood work today. I will be in touch when I have your results.  I would like you to see your eye doctor to have a dilated eye exam to look for PAPILLEDEMA. This would come with increased intracranial pressure, which your headaches do not currently sound like.  We will repeat your vessel imaging in 6-12 months to keep an eye on the possible aneurysm that is very small currently.  For your headaches: Migraine prevention:  Nortriptyline 10 mg at bedtime for 1 week, then increase to 20 mg at bedtime thereafter. We have a lot of room to go up on this medication if needed. Migraine rescue:  Sumatriptan 100 mg as needed, at onset of headache. You can take again after 2 hours if needed. Limit use of pain relievers to no more than 2 days out of week to prevent risk of rebound or medication-overuse headache. Keep headache diary Follow up in 1-2 months.  Please let me know if you have any questions or concerns in the meantime.   If you have sudden onset, worst headache of your life, or vision loss, or other sudden onset worrying symptoms, please seek immediate medical attention at an emergency department.  The physicians and staff at [facility] are committed to providing excellent care. You may receive a survey requesting feedback about your experience at our office. We strive to receive "very good" responses to the survey questions. If you feel that your experience would prevent you from giving the office a "very good " response, please contact our office to try to remedy the situation. We may be reached at [phone]. Thank you for taking the time out of your busy day to complete the survey.  Kai Levins, MD [facility]

## 2022-04-24 NOTE — Progress Notes (Signed)
NEUROLOGY FOLLOW UP OFFICE NOTE  Tammy Velez FO:[id number]  Subjective:  Tammy Velez is a 57 y.o. year old right-handed female with a medical history of HLD, IBS, asthma, hypothyroidism, kidney stones who we last saw on 04/02/22.  To briefly review: Patient started having "migraines" when going through menopause (~2017). They were located on the right side, with pressure behind the eyes, with photophobia and phonophobia, and dizziness when standing. She denies nausea or vomiting. She would have maybe 1 per month. Over the last year (2023), they were more frequent, about 1 headache every 1-2 weeks.    Over the last 6 months (beginning at the end of 2023), they also became more intense and frequent. She also noticed post-orgasmic headache with a sharp pain on the right side of head that would last 20-30 seconds followed by an intense typically migraine. They now happen even without orgasm. She can have word finding problems, blurry vision. She does not have clear vision loss, but does think she requires more light to see than previous.   She denies any headache aura.   She prefers to lay in a cold, dark, room when she gets a headache. She denies worsening of symptoms with position (stay the same when she lays down). Her headaches do not get worse with baring down (such as with bowel movements). She denies jaw claudication or loss of vision.   She takes ibuprofen daily for headaches. She has never been on headache medications.   Patient has a family history of cerebral aneurysm. Paternal grandfather died from brain aneurysm in his 3s.   MRI brain on 03/18/22 showed a partially empty sella. 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   Patient takes pellets testosterone and estrogen.    She drinks 1 regular cup of coffee daily. She will occasionally drink a soda. She drinks 2 cocktails per week. She does not smoke.   She takes vit D and a probiotic.    She saw her eye doctor about 1 year ago and is planning to go again soon.  Most recent Assessment and Plan (04/02/22): Her neurological examination is essentially normal today. I was not able to visualize her fundus to evaluate for papilledema. 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 Patient's symptoms are not consistent with increased intracranial pressure (such as IIH) and aneurysm is small and not likely to be causing symptoms, though I will monitor it. 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 She has never previously been on treatment. I will work up and treat as below.   PLAN: -Blood work: B12, ESR -Recommend patient see her ophthalmologist for dilated exam to rule out papilledema -Repeat vessel imaging in 6-12 months -No contraindication to continue hormone replacement, but did discuss risks and benefits including potential increased risk of blood clots and stroke -For headaches: Migraine prevention:  nortriptyline 10 mg qhs for 1 week, then increase 20 mg qhs Migraine rescue:  sumatriptan 100 mg PRN Limit use of pain relievers to no more than 2 days out of week to prevent risk of rebound or medication-overuse headache. Keep headache diary  Since their last visit: B12 was borderline low (256), so supplementation was recommended (1000 mcg daily). She is taking that.  ESR was elevated to 52.   Patient has had 1 headache since last visit. She is only  on Nortriptyline 10 mg qhs. She never went to 20 mg. When she had one headache, she took imitrex and headache quickly resolved. She has not had any headaches with orgasm.  Patient has appointment with her eye doctor on Monday.  Patient mentions minor cognitive difficulties. She endorses poor sleep. She does not feel rested when she wakes. She feels very tired in the afternoon. She  does snore. She has never had a sleep study.  MEDICATIONS:  Outpatient Encounter Medications as of 05/01/2022  Medication Sig   albuterol (PROVENTIL HFA;VENTOLIN HFA) 108 (90 BASE) MCG/ACT inhaler Inhale 1-2 puffs into the lungs every 6 (six) hours as needed for wheezing or shortness of breath.    atorvastatin (LIPITOR) 40 MG tablet Take 1 tablet (40 mg total) by mouth daily. Please call our office to schedule an overdue appointment with Dr. Radford Pax before anymore refills. [phone]. Thank you 2nd attempt (Patient taking differently: Take 20 mg by mouth daily. Please call our office to schedule an overdue appointment with Dr. Radford Pax before anymore refills. [phone]. Thank you 2nd attempt)   ESTRADIOL PO Take 10 mg by mouth See admin instructions.   furosemide (LASIX) 20 MG tablet Take 1 tablet (20 mg total) by mouth as needed for edema.   Naproxen (NAPROSYN PO) Take 1 capsule by mouth 3 (three) times daily as needed.   nortriptyline (PAMELOR) 10 MG capsule Take 1 capsule (10 mg total) by mouth at bedtime.   SUMAtriptan (IMITREX) 100 MG tablet Take 1 tablet (100 mg total) by mouth once as needed for up to 1 dose for migraine. May repeat in 2 hours if headache persists or recurs.   Testosterone 100 MG PLLT Take 100 mg by mouth See admin instructions.   Testosterone 50 MG PLLT Take 50 mg by mouth See admin instructions.   [DISCONTINUED] nortriptyline (PAMELOR) 10 MG capsule Take 2 capsules (20 mg total) by mouth at bedtime. Take 1 capsule (10 mg) at bedtime for 1 week, then increase to 2 capsules (20 mg) thereafter.   No facility-administered encounter medications on file as of 05/01/2022.    PAST MEDICAL HISTORY: Past Medical History:  Diagnosis Date   Asthma    Allegery induced   Complication of anesthesia 11/05/11    Pt re-intubated in OR due to residual muscle weakness   Constipation    Coronary artery calcification seen on CAT scan    Cough 07/21/2010   Dysmenorrhea 01/31/2016    Eczema    Headache    migraine   History of kidney stones    Hyperlipidemia    Insulin-resistant diabetes mellitus and acanthosis nigricans    diet controlled does not do CBGs   Left ureteral calculus    Nausea & vomiting    Upper respiratory infection    Ureter, calculus 11/05/2011   URI (upper respiratory infection) 03/10/2011    PAST SURGICAL HISTORY: Past Surgical History:  Procedure Laterality Date   BACK SURGERY  02/02/14   L5- S1 discectomy   CESAREAN SECTION  2002 &  2005   x2   CHOLECYSTECTOMY N/A 10/06/2014   Procedure: LAPAROSCOPIC CHOLECYSTECTOMY WITH INTRAOPERATIVE CHOLANGIOGRAM;  Surgeon: Autumn Messing III, MD;  Location: [facility];  Service: General;  Laterality: N/A;   CYSTO/ RIGHT RETROGRADE PYELOGRAM/ RIGHT URETERAL STENT PLACEMENT  10-28-2010   RIGHT RENAL STONE   CYSTOSCOPY WITH URETEROSCOPY  11/05/2011   Procedure: CYSTOSCOPY WITH URETEROSCOPY;  Surgeon: Bernestine Amass, MD;  Location: [facility];  Service: Urology;  Laterality:  Left;  URETEROSCOPY , STONE EXTRACTION POSSIBLE BASKET RETROGRADE AND HOLMIUM LASER AND POSSIBLE STENT   C ARM  LASER    DILITATION & CURRETTAGE/HYSTROSCOPY WITH NOVASURE ABLATION N/A 09/09/2013   Procedure: DILATATION & CURETTAGE/HYSTEROSCOPY WITH NOVASURE ABLATION;  Surgeon: Lovenia Kim, MD;  Location: [facility];  Service: Gynecology;  Laterality: N/A;   ROBOTIC ASSISTED TOTAL HYSTERECTOMY WITH SALPINGECTOMY Bilateral 01/31/2016   Procedure: ROBOTIC ASSISTED TOTAL HYSTERECTOMY WITH SALPINGECTOMY;  Surgeon: Brien Few, MD;  Location: [facility];  Service: Gynecology;  Laterality: Bilateral;    ALLERGIES: No Known Allergies  FAMILY HISTORY: Family History  Problem Relation Age of Onset   Heart disease Mother    Atrial fibrillation Mother    Mitral valve prolapse Mother     SOCIAL HISTORY: Social History   Tobacco Use   Smoking status: Never   Smokeless tobacco: Never  Vaping Use   Vaping Use: Never used  Substance  Use Topics   Alcohol use: No    Alcohol/week: 3.0 standard drinks of alcohol    Types: 2 Glasses of wine, 1 Standard drinks or equivalent per week    Comment: ocassional   Drug use: No   Social History   Social History Narrative   Are you right handed or left handed? Right   Are you currently employed ? 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   Who lives with you? Husband and 2 children   What type of home do you live in: 1 story or 2 story? two   Caffeine 1 cup       Objective:  Vital Signs:  BP (!) 141/88   Pulse 95   Ht 5\' 4"  (1.626 m)   Wt 272 lb (123.4 kg)   LMP 01/24/2016 (Exact Date)   SpO2 100%   BMI 46.69 kg/m   General: No acute distress.  Patient appears well-groomed.   Head:  Normocephalic/atraumatic Neck: supple Lungs:  Non-labored breathing on room air  Neurological Exam: alert and oriented to person, place, and time.  Speech fluent and not dysarthric, language intact.  CN II-XII intact. Bulk and tone normal, muscle strength 5/5 throughout.  Sensation to light touch intact.  Deep tendon reflexes 2+ throughout.  Finger to nose testing intact.  Gait normal.   Labs and Imaging review: New results: 04/02/22: ESR: 52 B12: 256  Previously reviewed results: Internal labs: Recent Labs       Lab Results  Component Value Date    HGBA1C 5.4 06/02/2017        Recent Labs[] Expand by Default       Lab Results  Component Value Date    TSH 2.500 06/02/2017        External labs: CMP (02/25/22): unremarkable HbA1c (02/22/21): 5.6 TSH( 03/2022): 1.687   Imaging: MRI/MRA brain wo contrast (03/15/22): FINDINGS: MRI HEAD FINDINGS   Brain: There is no evidence of an acute infarct, intracranial hemorrhage, mass, midline shift, or extra-axial fluid collection. The ventricles and sulci are normal. No significant white matter disease is evident. An incidental developmental venous anomaly is noted in the right basal  ganglia. There is a partially empty sella. The cerebellar tonsils are normally positioned.   Vascular: Major intracranial vascular flow voids are preserved.   Skull and upper cervical spine: Unremarkable bone marrow signal.   Sinuses/Orbits: Unremarkable orbits. No significant inflammatory changes in the paranasal sinuses. Small right mastoid effusion.   Other: None.  MRA HEAD FINDINGS   Anterior circulation: The internal carotid arteries are widely patent from skull base to carotid termini. There is a 2 mm protrusion from the right supraclinoid ICA in the posterior communicating region. The ACAs and MCAs are patent without evidence of a proximal branch occlusion or significant proximal stenosis.   Posterior circulation: The included distal portions of the intracranial vertebral arteries are patent to the basilar. The basilar artery is widely patent. Patent SCA origins are seen bilaterally. There may be diminutive posterior communicating arteries bilaterally. Both PCAs are patent without evidence of a significant proximal stenosis. No aneurysm is identified.   Anatomic variants: None.   IMPRESSION: 1. Partially empty sella, often an incidental finding though can be seen with intracranial hypertension. 2. Otherwise unremarkable appearance of the brain. 3. 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 4. Otherwise unremarkable head MRA.  Assessment/Plan:  This is Tammy Velez, a 57 y.o. 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 She has had 1 headache in the last month since starting nortriptyline. The headache resolved quickly with sumatriptan.  Plan: -Repeat vessel imaging ~09/2022 -Continue B12 1000 mcg daily -Migraine prevention:  Nortriptyline 10 mg qhs -Migraine rescue:  Sumatriptan 100 mg PRN -Limit use of pain relievers to no more than 2 days out of week to prevent risk of rebound or medication-overuse headache. -Keep headache diary  -Sleep study  for possible sleep apnea  Return to clinic in 5-6 months (~09/2022)   Kai Levins, MD

## 2022-05-01 ENCOUNTER — Encounter: Payer: Self-pay | Admitting: Neurology

## 2022-05-01 ENCOUNTER — Ambulatory Visit: Payer: BC Managed Care – PPO | Admitting: Neurology

## 2022-05-01 VITALS — BP 141/88 | HR 95 | Ht 64.0 in | Wt 272.0 lb

## 2022-05-01 DIAGNOSIS — R0683 Snoring: Secondary | ICD-10-CM

## 2022-05-01 DIAGNOSIS — F40298 Other specified phobia: Secondary | ICD-10-CM | POA: Diagnosis not present

## 2022-05-01 DIAGNOSIS — G43009 Migraine without aura, not intractable, without status migrainosus: Secondary | ICD-10-CM | POA: Diagnosis not present

## 2022-05-01 DIAGNOSIS — H538 Other visual disturbances: Secondary | ICD-10-CM | POA: Diagnosis not present

## 2022-05-01 DIAGNOSIS — I671 Cerebral aneurysm, nonruptured: Secondary | ICD-10-CM

## 2022-05-01 DIAGNOSIS — H53149 Visual discomfort, unspecified: Secondary | ICD-10-CM

## 2022-05-01 DIAGNOSIS — Z8249 Family history of ischemic heart disease and other diseases of the circulatory system: Secondary | ICD-10-CM

## 2022-05-01 DIAGNOSIS — Z72820 Sleep deprivation: Secondary | ICD-10-CM

## 2022-05-01 MED ORDER — NORTRIPTYLINE HCL 10 MG PO CAPS: 10.0000 mg | ORAL_CAPSULE | Freq: Every day | ORAL | 3 refills | Status: DC

## 2022-05-01 NOTE — Patient Instructions (Signed)
Migraine prevention:  Nortriptyline 10 mg at bedtime Migraine rescue:  Sumatriptan 100 mg as needed at onset of headache Limit use of pain relievers to no more than 2 days out of week to prevent risk of rebound or medication-overuse headache. Keep headache diary Follow up in 5-6 months when we will repeat your vessel imaging  Continue taking B12 1000 mcg daily.  I would like to send you for a sleep study to evaluate your sleep as you mentioned cognitive changes. This is the #1 reason young people have these problems. Snoring and not feeling rested suggests possible sleep apnea.  The physicians and staff at [facility] are committed to providing excellent care. You may receive a survey requesting feedback about your experience at our office. We strive to receive "very good" responses to the survey questions. If you feel that your experience would prevent you from giving the office a "very good " response, please contact our office to try to remedy the situation. We may be reached at [phone]. Thank you for taking the time out of your busy day to complete the survey.  Kai Levins, MD [facility]

## 2022-05-05 ENCOUNTER — Telehealth: Payer: Self-pay | Admitting: Neurology

## 2022-05-05 NOTE — Telephone Encounter (Signed)
Patient just left the eye Dr and she wanted to let Dr Berdine Addison know that they said there was some swelling in the eye. They will send over report as well

## 2022-05-08 ENCOUNTER — Telehealth: Payer: Self-pay | Admitting: Neurology

## 2022-05-08 DIAGNOSIS — G43009 Migraine without aura, not intractable, without status migrainosus: Secondary | ICD-10-CM

## 2022-05-08 NOTE — Telephone Encounter (Signed)
Called patient to discuss findings of Dr Clydene Laming, her eye doctor. 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  Patient agreed with this. I will order today.  All questions were answered.  Kai Levins, MD [facility]

## 2022-05-09 NOTE — Addendum Note (Signed)
Addended by: Allean Found R on: 05/09/2022 08:51 AM   Modules accepted: Orders

## 2022-05-12 ENCOUNTER — Other Ambulatory Visit: Payer: Self-pay

## 2022-05-12 DIAGNOSIS — F40298 Other specified phobia: Secondary | ICD-10-CM

## 2022-05-12 DIAGNOSIS — H53149 Visual discomfort, unspecified: Secondary | ICD-10-CM

## 2022-05-12 DIAGNOSIS — H538 Other visual disturbances: Secondary | ICD-10-CM

## 2022-05-12 DIAGNOSIS — G43009 Migraine without aura, not intractable, without status migrainosus: Secondary | ICD-10-CM

## 2022-05-21 ENCOUNTER — Encounter: Payer: Self-pay | Admitting: Nurse Practitioner

## 2022-05-21 ENCOUNTER — Ambulatory Visit (INDEPENDENT_AMBULATORY_CARE_PROVIDER_SITE_OTHER): Payer: BC Managed Care – PPO | Admitting: Nurse Practitioner

## 2022-05-21 VITALS — BP 124/84 | HR 88 | Temp 98.9°F | Ht 64.0 in | Wt 267.0 lb

## 2022-05-21 DIAGNOSIS — R0683 Snoring: Secondary | ICD-10-CM | POA: Diagnosis not present

## 2022-05-21 DIAGNOSIS — G4719 Other hypersomnia: Secondary | ICD-10-CM

## 2022-05-21 NOTE — Progress Notes (Signed)
  ID: Tammy Velez, female    DOB: 09-May-1965, 57 y.o.   MRN: [id number]  Chief Complaint  Patient presents with   Consult    Snoring, trouble falling asleep and staying asleep.  Memory problems.  Sx 2 years.    Referring provider: Antony Madura, MD  HPI: 57 year old female, never smoker referred for sleep consult.  Past medical history significant for DM, HLD, obesity.  TEST/EVENTS:   05/21/2022: Today-sleep consult Patient presents today for sleep consult, referred by Dr. Loleta Chance.  She had started having some trouble with memory impairment and was being evaluated by neurology.  She also has history of migraines.  She was advised to have sleep study for further evaluation.  She does have trouble sleeping at night and tends to wake up frequently.  Is usually tired when she wakes up in the morning.  She has daytime fatigue.  She has been told that she snores at night.  Never been told that she stops breathing when she sleeps at night.  Denies any morning headaches, drowsy driving, sleep parasomnia/paralysis.  No history of narcolepsy or symptoms of cataplexy. She goes to bed between 10 PM and 11 PM.  Falls asleep within 30 minutes to 1 hour.  Wakes 2-3 times at night.  Officially gets up around 6:30 AM.  Will sometimes take Tylenol PM or over-the-counter sleep aids to help her fall asleep.  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  Weight is up 20 to 30 pounds over the last 2 years.  Never had a previous sleep study before. No history of high blood pressure, cardiac arrhythmias, stroke or diabetes.  She is a never smoker.  Drinks alcohol socially on special occasions.  No excessive caffeine intake.  Lives with her spouse and 2 children.  Works as a Marketing executive. Family history of maternal grandfather with pancreatic cancer and mother with colon cancer.  Epworth 8  No Known Allergies  Immunization History  Administered Date(s) Administered   Influenza Split 11/13/2010    Influenza,inj,Quad PF,6+ Mos 02/01/2016, 12/04/2016   PFIZER(Purple Top)SARS-COV-2 Vaccination 04/09/2019, 04/30/2019    Past Medical History:  Diagnosis Date   Asthma    Allegery induced   Complication of anesthesia 11/05/11    Pt re-intubated in OR due to residual muscle weakness   Constipation    Coronary artery calcification seen on CAT scan    Cough 07/21/2010   Dysmenorrhea 01/31/2016   Eczema    Headache    migraine   History of kidney stones    Hyperlipidemia    Insulin-resistant diabetes mellitus and acanthosis nigricans    diet controlled does not do CBGs   Left ureteral calculus    Nausea & vomiting    Upper respiratory infection    Ureter, calculus 11/05/2011   URI (upper respiratory infection) 03/10/2011    Tobacco History: Social History   Tobacco Use  Smoking Status Never  Smokeless Tobacco Never   Counseling given: Not Answered   Outpatient Medications Prior to Visit  Medication Sig Dispense Refill   albuterol (PROVENTIL HFA;VENTOLIN HFA) 108 (90 BASE) MCG/ACT inhaler Inhale 1-2 puffs into the lungs every 6 (six) hours as needed for wheezing or shortness of breath.      atorvastatin (LIPITOR) 40 MG tablet Take 1 tablet (40 mg total) by mouth daily. Please call our office to schedule an overdue appointment with Dr. Mayford Knife before anymore refills. [phone]. Thank you 2nd attempt (Patient taking differently: Take 20 mg  by mouth daily. Please call our office to schedule an overdue appointment with Dr. Mayford Knife before anymore refills. [phone]. Thank you 2nd attempt) 15 tablet 0   furosemide (LASIX) 20 MG tablet Take 1 tablet (20 mg total) by mouth as needed for edema. 25 tablet 3   liothyronine (CYTOMEL) 5 MCG tablet Take 10 mcg by mouth daily.     Naproxen (NAPROSYN PO) Take 1 capsule by mouth 3 (three) times daily as needed.     nortriptyline (PAMELOR) 10 MG capsule Take 1 capsule (10 mg total) by mouth at bedtime. 90 capsule 3   SEMAGLUTIDE-WEIGHT  MANAGEMENT [place] Inject 1 Dose into the skin once a week.     SUMAtriptan (IMITREX) 100 MG tablet Take 1 tablet (100 mg total) by mouth once as needed for up to 1 dose for migraine. May repeat in 2 hours if headache persists or recurs. 10 tablet 2   Testosterone 100 MG PLLT Take 100 mg by mouth See admin instructions.     Testosterone 50 MG PLLT Take 50 mg by mouth See admin instructions.     ESTRADIOL PO Take 10 mg by mouth See admin instructions. (Patient not taking: Reported on 05/21/2022)     No facility-administered medications prior to visit.     Review of Systems:   Constitutional: No night sweats, fevers, chills, or lassitude. +fatigue, weight gain HEENT: No difficulty swallowing, tooth/dental problems, or sore throat. No sneezing, itching, ear ache, nasal congestion, or post nasal drip. +headaches CV:  No chest pain, orthopnea, PND, swelling in lower extremities, anasarca, dizziness, palpitations, syncope Resp: +snoring, shortness of breath with exertion, occasional cough. No excess mucus or change in color of mucus. No hemoptysis. No wheezing.  No chest wall deformity GI:  +heartburn, indigestion (occasional). No abdominal pain, nausea, vomiting, diarrhea, change in bowel habits, loss of appetite, bloody stools.  GU: No dysuria, change in color of urine, urgency or frequency.   Skin: No rash, lesions, ulcerations MSK:  No joint pain or swelling.   Neuro: No dizziness or lightheadedness.  Psych: No depression or anxiety. Mood stable. +sleep disturbance     Physical Exam:  BP 124/84 (BP Location: Right Arm, Patient Position: Sitting, Cuff Size: Large)   Pulse 88   Temp 98.9 F (37.2 C) (Oral)   Ht 5\' 4"  (1.626 m)   Wt 267 lb (121.1 kg)   LMP 01/24/2016 (Exact Date)   SpO2 96%   BMI 45.83 kg/m   GEN: Pleasant, interactive, well-appearing; obese; in no acute distress. HEENT:  Normocephalic and atraumatic. PERRLA. Sclera white. Nasal turbinates pink, moist and patent  bilaterally. No rhinorrhea present. Oropharynx pink and moist, without exudate or edema. No lesions, ulcerations, or postnasal drip. Mallampati III NECK:  Supple w/ fair ROM. No JVD present. Normal carotid impulses w/o bruits. Thyroid symmetrical with no goiter or nodules palpated. No lymphadenopathy.   CV: RRR, no m/r/g, no peripheral edema. Pulses intact, +2 bilaterally. No cyanosis, pallor or clubbing. PULMONARY:  Unlabored, regular breathing. Clear bilaterally A&P w/o wheezes/rales/rhonchi. No accessory muscle use.  GI: BS present and normoactive. Soft, non-tender to palpation. No organomegaly or masses detected.  MSK: No erythema, warmth or tenderness. Cap refil <2 sec all extrem. No deformities or joint swelling noted.  Neuro: A/Ox3. No focal deficits noted.   Skin: Warm, no lesions or rashe Psych: Normal affect and behavior. Judgement and thought content appropriate.     Lab Results:  CBC    Component Value Date/Time   WBC  11.9 (H) 02/01/2016 0517   RBC 3.73 (L) 02/01/2016 0517   HGB 11.6 (L) 02/01/2016 0517   HCT 35.4 (L) 02/01/2016 0517   PLT 333 02/01/2016 0517   MCV 94.9 02/01/2016 0517   MCH 31.1 02/01/2016 0517   MCHC 32.8 02/01/2016 0517   RDW 13.3 02/01/2016 0517   LYMPHSABS 2.7 10/03/2014 1442   MONOABS 0.7 10/03/2014 1442   EOSABS 0.2 10/03/2014 1442   BASOSABS 0.0 10/03/2014 1442    BMET    Component Value Date/Time   NA 144 06/02/2017 1618   K 4.2 06/02/2017 1618   CL 103 06/02/2017 1618   CO2 25 06/02/2017 1618   GLUCOSE 98 06/02/2017 1618   GLUCOSE 133 (H) 02/01/2016 0517   BUN 12 06/02/2017 1618   CREATININE 0.92 06/02/2017 1618   CALCIUM 9.0 06/02/2017 1618   GFRNONAA 73 06/02/2017 1618   GFRAA 84 06/02/2017 1618    BNP No results found for: "BNP"   Imaging:  No results found.        No data to display          Lab Results  Component Value Date   NITRICOXIDE 19 12/04/2016        Assessment & Plan:   Excessive daytime  sleepiness She has snoring, excessive daytime sleepiness, restless sleep. BMI 45. History of migraines. Given this,  I am concerned she could have sleep disordered breathing with obstructive sleep apnea. She will need sleep study for further evaluation.    - discussed how weight can impact sleep and risk for sleep disordered breathing - discussed options to assist with weight loss: combination of diet modification, cardiovascular and strength training exercises   - had an extensive discussion regarding the adverse health consequences related to untreated sleep disordered breathing - specifically discussed the risks for hypertension, coronary artery disease, cardiac dysrhythmias, cerebrovascular disease, and diabetes - lifestyle modification discussed   - discussed how sleep disruption can increase risk of accidents, particularly when driving - safe driving practices were discussed  Patient Instructions  Given your symptoms, I am concerned that you may have sleep disordered breathing with sleep apnea. You will need a sleep study for further evaluation. Someone will contact you to schedule this.   We discussed how untreated sleep apnea puts an individual at risk for cardiac arrhthymias, pulm HTN, DM, stroke and increases their risk for daytime accidents. We also briefly reviewed treatment options including weight loss, side sleeping position, oral appliance, CPAP therapy or referral to ENT for possible surgical options  Use caution when driving and pull over if you become sleepy.  Follow up in 6 weeks with Katie Elfreda Blanchet,NP to go over sleep study results, or sooner, if needed     Loud snoring See above  Obesity, Class III, BMI 40-49.9 (morbid obesity) BMI 45. Healthy weight loss encouraged.    I spent 35 minutes of dedicated to the care of this patient on the date of this encounter to include pre-visit review of records, face-to-face time with the patient discussing conditions above, post  visit ordering of testing, clinical documentation with the electronic health record, making appropriate referrals as documented, and communicating necessary findings to members of the patients care team.  Noemi Chapel, NP 05/25/2022  Pt aware and understands NP's role.

## 2022-05-21 NOTE — Patient Instructions (Signed)
Given your symptoms, I am concerned that you may have sleep disordered breathing with sleep apnea. You will need a sleep study for further evaluation. Someone will contact you to schedule this.   We discussed how untreated sleep apnea puts an individual at risk for cardiac arrhthymias, pulm HTN, DM, stroke and increases their risk for daytime accidents. We also briefly reviewed treatment options including weight loss, side sleeping position, oral appliance, CPAP therapy or referral to ENT for possible surgical options  Use caution when driving and pull over if you become sleepy.  Follow up in 6 weeks with Tammy Sativa Gelles,NP to go over sleep study results, or sooner, if needed   

## 2022-05-25 ENCOUNTER — Encounter: Payer: Self-pay | Admitting: Nurse Practitioner

## 2022-05-25 DIAGNOSIS — R0683 Snoring: Secondary | ICD-10-CM | POA: Insufficient documentation

## 2022-05-25 DIAGNOSIS — G4719 Other hypersomnia: Secondary | ICD-10-CM | POA: Insufficient documentation

## 2022-05-25 NOTE — Assessment & Plan Note (Signed)
She has snoring, excessive daytime sleepiness, restless sleep. BMI 45. History of migraines. Given this,  I am concerned she could have sleep disordered breathing with obstructive sleep apnea. She will need sleep study for further evaluation.    - discussed how weight can impact sleep and risk for sleep disordered breathing - discussed options to assist with weight loss: combination of diet modification, cardiovascular and strength training exercises   - had an extensive discussion regarding the adverse health consequences related to untreated sleep disordered breathing - specifically discussed the risks for hypertension, coronary artery disease, cardiac dysrhythmias, cerebrovascular disease, and diabetes - lifestyle modification discussed   - discussed how sleep disruption can increase risk of accidents, particularly when driving - safe driving practices were discussed  Patient Instructions  Given your symptoms, I am concerned that you may have sleep disordered breathing with sleep apnea. You will need a sleep study for further evaluation. Someone will contact you to schedule this.   We discussed how untreated sleep apnea puts an individual at risk for cardiac arrhthymias, pulm HTN, DM, stroke and increases their risk for daytime accidents. We also briefly reviewed treatment options including weight loss, side sleeping position, oral appliance, CPAP therapy or referral to ENT for possible surgical options  Use caution when driving and pull over if you become sleepy.  Follow up in 6 weeks with Tammy Malina Geers,NP to go over sleep study results, or sooner, if needed

## 2022-05-25 NOTE — Assessment & Plan Note (Signed)
See above

## 2022-05-25 NOTE — Assessment & Plan Note (Signed)
BMI 45. Healthy weight loss encouraged.  

## 2022-05-29 ENCOUNTER — Telehealth: Payer: Self-pay | Admitting: Neurology

## 2022-05-29 ENCOUNTER — Ambulatory Visit
Admission: RE | Admit: 2022-05-29 | Discharge: 2022-05-29 | Disposition: A | Discharge status: Home / Self Care | Payer: BC Managed Care – PPO | Source: Ambulatory Visit | Attending: Neurology | Admitting: Neurology

## 2022-05-29 DIAGNOSIS — G43009 Migraine without aura, not intractable, without status migrainosus: Secondary | ICD-10-CM

## 2022-05-29 DIAGNOSIS — G932 Benign intracranial hypertension: Secondary | ICD-10-CM

## 2022-05-29 LAB — CSF CELL COUNT WITH DIFFERENTIAL

## 2022-05-29 MED ORDER — ACETAZOLAMIDE 250 MG PO TABS
250.0000 mg | ORAL_TABLET | Freq: Two times a day (BID) | ORAL | 5 refills | Status: DC
Start: 2022-05-29 — End: 2022-06-04

## 2022-05-29 NOTE — Discharge Instructions (Signed)

## 2022-05-29 NOTE — Telephone Encounter (Signed)
Called patient to discuss results of lumbar puncture. It showed elevated pressure to 35. She mentioned that after LP today she had immediate relief of head pressure and vision was brighter.  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 We discussed treatment with first line therapy diamox, including common side effects. Patient agreed to begin this.  Updated plan: -Stop nortriptyline -Start Diamox 250 mg BID. May need to increase but will start low to attempt to prevent side effects. -Patient will follow up in ~2-3 months  All questions were answered.  Jacquelyne Balint, MD [facility] Neurology

## 2022-05-29 NOTE — Progress Notes (Signed)
1 vial of blood drawn from pts RAC to be sent off with LP lab work. 1 successful attempt, pt tolerated well. Gauze and tape applied after.  

## 2022-06-01 ENCOUNTER — Encounter: Payer: Self-pay | Admitting: Physician Assistant

## 2022-06-01 LAB — CSF CELL COUNT WITH DIFFERENTIAL
RBC Count, CSF: 0 cells/uL
TOTAL NUCLEATED CELL: 0 cells/uL (ref 0–5)

## 2022-06-01 LAB — CNS IGG SYNTHESIS RATE, CSF+BLOOD: IgG-Index: 0.4 (ref <0.70)

## 2022-06-01 NOTE — Progress Notes (Unsigned)
This is a 57 yo f patient of Dr. Loleta Chance recently diagnosed with IIH, recently placed on Diamox 250 mg bid  tolerating well.   Was called from the call center  [facility]) regarding Elevated CSF protein from LP performed on 05-29-2022.  It was 46 (nl parameters from [facility] 15-45). Called the patient and discussed the results. Denies any new  medical complaints at this time. Informed her that I will discuss these results with Dr. Loleta Chance for further recommendations. She was appreciative of the call

## 2022-06-02 ENCOUNTER — Telehealth: Payer: Self-pay | Admitting: Neurology

## 2022-06-02 DIAGNOSIS — R110 Nausea: Secondary | ICD-10-CM

## 2022-06-02 DIAGNOSIS — G932 Benign intracranial hypertension: Secondary | ICD-10-CM

## 2022-06-02 MED ORDER — ONDANSETRON 4 MG PO TBDP
4.0000 mg | ORAL_TABLET | Freq: Three times a day (TID) | ORAL | 0 refills | Status: DC | PRN
Start: 2022-06-02 — End: 2022-08-06

## 2022-06-02 NOTE — Telephone Encounter (Signed)
Patient called access nurse during lunch hours. I returned her call. She is having nausea and not able to eat since starting Diamox this weekend. She denied other symptoms.  We discussed that this could be a side effect of the medication. I explained that we could switch to Topamax if the medication was causing too many side effects as I could not predict if she would get used to and get past these side effects. Patient preferred to continue diamox for now and asked for symptomatic treatment of nausea.  I am sending zofran to her pharmacy. She will let me know if symptoms do not improve.  All questions were answered.  Jacquelyne Balint, MD [facility] Neurology

## 2022-06-02 NOTE — Telephone Encounter (Signed)
Vickie from [employer] called in and left a message with the access nurse on 06/01/22. Need to give lab results.   Full access nurse report is in Dr. Adaline Sill box

## 2022-06-03 LAB — PROTEIN, CSF: Total Protein, CSF: 46 mg/dL — ABNORMAL HIGH (ref 15–45)

## 2022-06-04 MED ORDER — TOPIRAMATE 25 MG PO TABS
25.0000 mg | ORAL_TABLET | Freq: Two times a day (BID) | ORAL | 5 refills | Status: DC
Start: 2022-06-04 — End: 2022-08-06

## 2022-06-04 NOTE — Telephone Encounter (Signed)
Pt called stating she is continuing with nausea, fatigue and weakness with the Diamox. Request call back.

## 2022-06-04 NOTE — Telephone Encounter (Signed)
Returned patient's call. She continues to have nausea that is interfering with her work since starting diamox. We will stop diamox today and start topamax. Patient is very frustrated with the symptoms and side effects, understandably so.  Plan: -Stop Diamox -Start topamax: 25 mg daily for 1 week, then increase to 25 mg BID. May need to increase further as needed.  All questions were answered.  Jacquelyne Balint, MD [facility] Neurology

## 2022-06-06 ENCOUNTER — Telehealth: Payer: Self-pay | Admitting: Neurology

## 2022-06-06 LAB — GLUCOSE, CSF: Glucose, CSF: 61 mg/dL (ref 40–80)

## 2022-06-06 LAB — CNS IGG SYNTHESIS RATE, CSF+BLOOD
Albumin Serum: 4.3 g/dL (ref 3.6–5.1)
CNS-IgG Synthesis Rate: -6.9 mg/24 h (ref <=3.3)

## 2022-06-06 NOTE — Telephone Encounter (Signed)
Returned patient's call. She mentioned a history of kidney stones. It was several stones, but many years ago. After surgery, she has not had recurrence since. We had a risk/benefit discussion, keeping in mind that options for treating IIH are limited and next step would be Lasix, which also has potential side effects. Patient opted to continue topiramate. She is feeling much better since stopping diamox.  All questions were answered.  Jacquelyne Balint, MD [facility] Neurology

## 2022-06-06 NOTE — Telephone Encounter (Signed)
Pt called in stating she is concerned about the topiramate side effects. She says she gets kidney stones and it says people who get kidney stones should not take that medication. She is wondering if there is a different option or if Dr. Loleta Chance thinks it might still be fine?

## 2022-06-07 LAB — CSF CELL COUNT WITH DIFFERENTIAL

## 2022-06-07 LAB — CNS IGG SYNTHESIS RATE, CSF+BLOOD
Albumin, CSF: 34.8 mg/dL (ref 8.0–42.0)
IgG (Immunoglobin G), Serum: 1270 mg/dL (ref 600–1640)
IgG Total CSF: 4.1 mg/dL (ref 0.8–7.7)

## 2022-06-07 LAB — MYELIN BASIC PROTEIN, CSF: Myelin Basic Protein: <2 mcg/L (ref <=4.0)

## 2022-06-07 LAB — ANGIOTENSIN CONVERTING ENZYME, CSF: ANGIOTENSIN CONVERTING ENZYME ( ACE) CSF: 5 U/L (ref <=15)

## 2022-06-13 ENCOUNTER — Other Ambulatory Visit: Payer: Self-pay

## 2022-06-13 ENCOUNTER — Ambulatory Visit (INDEPENDENT_AMBULATORY_CARE_PROVIDER_SITE_OTHER): Payer: BC Managed Care – PPO | Admitting: Nurse Practitioner

## 2022-06-13 DIAGNOSIS — G4733 Obstructive sleep apnea (adult) (pediatric): Secondary | ICD-10-CM

## 2022-06-13 DIAGNOSIS — R0683 Snoring: Secondary | ICD-10-CM

## 2022-06-13 DIAGNOSIS — G4719 Other hypersomnia: Secondary | ICD-10-CM

## 2022-06-16 NOTE — Progress Notes (Signed)
Mild OSA with AHI 5.7/h on HST. Please schedule video visit follow up next Thursday to review and discuss treatment options. I am in [place] so if she does not want to do a video visit, she is more than welcome to come to the office there! Thanks!

## 2022-06-24 ENCOUNTER — Telehealth: Payer: Self-pay | Admitting: Neurology

## 2022-06-24 NOTE — Telephone Encounter (Signed)
Pt called in concerned because she has noticed she has a floater (black spot) in her right eye. She also smells a metallic smell or like engine exhaust.

## 2022-06-25 ENCOUNTER — Telehealth: Payer: Self-pay | Admitting: Neurology

## 2022-06-25 NOTE — Telephone Encounter (Signed)
  Called patient and informed her that per Dr. Loleta Chance he is not sure of the cause of her symptoms and that it is not something typically associated with IIH. Patient was also informed that it could be a side effect of the Topamax, but again, would be unusual.   Patient stated that she has had headaches this past week. One of her headaches she had to take sumatriptan. Patient reports her floaters started after she was diagnosed with the papilledema and saw an eye doctor before the floaters started. Patient reports no other vision changes.

## 2022-06-25 NOTE — Telephone Encounter (Signed)
Returned patient's call. Patient has fluctuating headaches. She felt best after her lumbar puncture. Last week, patient has noticed more headaches. She had a bad headache after laying into bed. She got up and did not improve symptoms. It did not seem positional. She took Imitrex that helped headache, but there has been a background mild HA since. She has had floaters in her vision and smelled exhaust like smell. It can even wake her up at night. This is intermittent. The floater and smell do not seem related.  Patient had her sleep study that showed mild OSA. Patient has also lost 15 pounds since last visit. She does endorse poor sleep. She has not followed up with sleep medicine. Patient will call sleep medicine to discuss treatment for possible insomnia.  We discussed making medication changes, increasing topamax or readding nortriptyline, but patient would like to monitor for now.  All questions were answered.  Jacquelyne Balint, MD [facility] Neurology

## 2022-07-09 ENCOUNTER — Telehealth (INDEPENDENT_AMBULATORY_CARE_PROVIDER_SITE_OTHER): Payer: BC Managed Care – PPO | Admitting: Nurse Practitioner

## 2022-07-09 ENCOUNTER — Encounter: Payer: Self-pay | Admitting: Nurse Practitioner

## 2022-07-09 DIAGNOSIS — F5104 Psychophysiologic insomnia: Secondary | ICD-10-CM | POA: Diagnosis not present

## 2022-07-09 DIAGNOSIS — G4733 Obstructive sleep apnea (adult) (pediatric): Secondary | ICD-10-CM | POA: Diagnosis not present

## 2022-07-09 DIAGNOSIS — F5101 Primary insomnia: Secondary | ICD-10-CM

## 2022-07-09 MED ORDER — ZOLPIDEM TARTRATE 5 MG PO TABS
5.0000 mg | ORAL_TABLET | Freq: Every evening | ORAL | 5 refills | Status: DC | PRN
Start: 2022-07-09 — End: 2023-05-28

## 2022-07-09 NOTE — Progress Notes (Signed)
Patient ID: Tammy Velez, female     DOB: 1966-01-09, 57 y.o.      MRN: [id number]  Chief Complaint  Patient presents with   Follow-up    Hst results     Virtual Visit via Video Note  I connected with Tammy Velez on 07/09/22 at 10:00 AM EDT by a video enabled telemedicine application and verified that I am speaking with the correct person using two identifiers.  Location: Patient: Home Provider: Office   I discussed the limitations of evaluation and management by telemedicine and the availability of in person appointments. The patient expressed understanding and agreed to proceed.  History of Present Illness: 57 year old female, never smoker referred for sleep consult April 2024.  Past medical history significant for DM, HLD, obesity.   TEST/EVENTS:  06/03/2022 HST: AHI 5.7/h, SpO2 low 86%  05/21/2022: OV with Rieley Khalsa NP for sleep consult, referred by Dr. Loleta Chance.  She had started having some trouble with memory impairment and was being evaluated by neurology.  She also has history of migraines.  She was advised to have sleep study for further evaluation.  She does have trouble sleeping at night and tends to wake up frequently.  Is usually tired when she wakes up in the morning.  She has daytime fatigue.  She has been told that she snores at night.  Never been told that she stops breathing when she sleeps at night.  Denies any morning headaches, drowsy driving, sleep parasomnia/paralysis.  No history of narcolepsy or symptoms of cataplexy. She goes to bed between 10 PM and 11 PM.  Falls asleep within 30 minutes to 1 hour.  Wakes 2-3 times at night.  Officially gets up around 6:30 AM.  Will sometimes take Tylenol PM or over-the-counter sleep aids to help her fall asleep.  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  Weight is up 20 to 30 pounds over the last 2 years.  Never had a previous sleep study before. No history of high blood pressure, cardiac arrhythmias, stroke or diabetes.  She  is a never smoker.  Drinks alcohol socially on special occasions.  No excessive caffeine intake.  Lives with her spouse and 2 children.  Works as a Marketing executive. Family history of maternal grandfather with pancreatic cancer and mother with colon cancer. Epworth 8  07/09/2022: Today - follow up Patient presents today for follow up to discuss home sleep study results, which revealed mild sleep apnea. She continues to have trouble with sleep quality at night. She really struggles to fall asleep. She will take Tylenol PM to help with this. Has been on Ambien in the past, which helped. Tolerated well. She does have daytime fatigue but feels this is more so related to her difficulties falling asleep. She does not want to use a CPAP. Had trouble with the home sleep study equipment so doesn't feel as though she could tolerate one. She is open to alternative options. She is working on weight loss measures. Down almost 10 lb since she was here last. She denies drowsy driving or sleep parasomnias/paralysis.   No Known Allergies Immunization History  Administered Date(s) Administered   Influenza Split 11/13/2010   Influenza,inj,Quad PF,6+ Mos 02/01/2016, 12/04/2016   PFIZER(Purple Top)SARS-COV-2 Vaccination 04/09/2019, 04/30/2019   Past Medical History:  Diagnosis Date   Asthma    Allegery induced   Complication of anesthesia 11/05/11    Pt re-intubated in OR due to residual muscle weakness   Constipation  Coronary artery calcification seen on CAT scan    Cough 07/21/2010   Dysmenorrhea 01/31/2016   Eczema    Headache    migraine   History of kidney stones    Hyperlipidemia    Insulin-resistant diabetes mellitus and acanthosis nigricans    diet controlled does not do CBGs   Left ureteral calculus    Nausea & vomiting    Upper respiratory infection    Ureter, calculus 11/05/2011   URI (upper respiratory infection) 03/10/2011    Tobacco History: Social History   Tobacco Use  Smoking  Status Never  Smokeless Tobacco Never   Counseling given: Not Answered   Outpatient Medications Prior to Visit  Medication Sig Dispense Refill   albuterol (PROVENTIL HFA;VENTOLIN HFA) 108 (90 BASE) MCG/ACT inhaler Inhale 1-2 puffs into the lungs every 6 (six) hours as needed for wheezing or shortness of breath.      atorvastatin (LIPITOR) 40 MG tablet Take 1 tablet (40 mg total) by mouth daily. Please call our office to schedule an overdue appointment with Dr. Mayford Knife before anymore refills. [phone]. Thank you 2nd attempt (Patient taking differently: Take 20 mg by mouth daily. Please call our office to schedule an overdue appointment with Dr. Mayford Knife before anymore refills. [phone]. Thank you 2nd attempt) 15 tablet 0   liothyronine (CYTOMEL) 5 MCG tablet Take 10 mcg by mouth daily.     Naproxen (NAPROSYN PO) Take 1 capsule by mouth 3 (three) times daily as needed.     SEMAGLUTIDE-WEIGHT MANAGEMENT [place] Inject 1 Dose into the skin once a week.     SUMAtriptan (IMITREX) 100 MG tablet Take 1 tablet (100 mg total) by mouth once as needed for up to 1 dose for migraine. May repeat in 2 hours if headache persists or recurs. 10 tablet 2   Testosterone 100 MG PLLT Take 100 mg by mouth See admin instructions.     Testosterone 50 MG PLLT Take 50 mg by mouth See admin instructions.     topiramate (TOPAMAX) 25 MG tablet Take 1 tablet (25 mg total) by mouth 2 (two) times daily. Take 1 tablet (25 mg) daily for 1 week, then increase to 1 tablet (25 mg) twice daily thereafter. 60 tablet 5   ESTRADIOL PO Take 10 mg by mouth See admin instructions. (Patient not taking: Reported on 05/21/2022)     furosemide (LASIX) 20 MG tablet Take 1 tablet (20 mg total) by mouth as needed for edema. (Patient not taking: Reported on 07/09/2022) 25 tablet 3   ondansetron (ZOFRAN-ODT) 4 MG disintegrating tablet Take 1 tablet (4 mg total) by mouth every 8 (eight) hours as needed for nausea or vomiting. 20 tablet 0   No  facility-administered medications prior to visit.     Review of Systems:   Constitutional: No night sweats, fevers, chills, or lassitude. +fatigue, intentional weight loss  HEENT: No difficulty swallowing, tooth/dental problems, or sore throat. No sneezing, itching, ear ache, nasal congestion, or post nasal drip. +headaches  CV:  No chest pain, orthopnea, PND, swelling in lower extremities, anasarca, dizziness, palpitations, syncope Resp: +snoring, shortness of breath with exertion (baseline); occasional cough. No excess mucus or change in color of mucus. No hemoptysis. No wheezing.  No chest wall deformity GI:  +occasional heartburn, indigestion (stable). No abdominal pain, nausea, vomiting, diarrhea, change in bowel habits, loss of appetite, bloody stools.  GU: No nocturia  MSK:  No joint pain or swelling.   Neuro: No dizziness or lightheadedness.  Psych: No depression  or anxiety. Mood stable. +sleep disturbance   Observations/Objective: Patient is well-developed, well-nourished in no acute distress. A&Ox3. Resting comfortably at home. Unlabored breathing. Speech is clear and coherent with logical content.   Assessment and Plan: Mild obstructive sleep apnea Mild OSA with AHI 5.7/h. Reviewed risks of untreated OSA and potential treatment options. She is actively working on weight loss measures. She would like to move forward with oral appliance; referral to orthodontics placed today. She will focus on positional sleeping in the interim. Cautioned on safe driving practices.  Patient Instructions  Your sleep study revealed mild sleep apnea. We discussed how untreated sleep apnea puts an individual at risk for cardiac arrhthymias, pulm HTN, DM, stroke and increases their risk for daytime accidents. We also briefly reviewed treatment options including weight loss, side sleeping position, oral appliance, CPAP therapy. You opted to move forward with weight loss, positional sleeping and oral  appliance.  Referral to orthodontics  Start Ambien 5 mg At bedtime as needed for sleep. Take immediately before bed.  -Do not drive or operate heavy machinery after taking. Monitor mood closely and notify of any changes. We reviewed other potential side effects including unusual sleep behaviors to monitor for. Do not take with other sedating medications. May cause AM drowsiness. We also discussed that this can make sleep apnea worse so you should ensure that you are focusing on positional sleeping and using oral appliance after taking.   Follow up in 6 weeks with Dr. Wynona Neat (1st) or Katie Raymone Pembroke,NP to see how new medication is working. If symptoms worsen, please contact office for sooner follow up or seek emergency care.    Chronic insomnia Longstanding history of insomnia. Previously on Ambien. Continues to have difficulties primarily with sleep latency. Reviewed sleep hygiene. Discussed non-pharmacological therapy and reviewed potential pharmacological therapy options. Shared decision to restart Ambien. Side effect profile reviewed. Cautioned on risks of sedatives with OSA. Verbalized understanding. See above.     I discussed the assessment and treatment plan with the patient. The patient was provided an opportunity to ask questions and all were answered. The patient agreed with the plan and demonstrated an understanding of the instructions.   The patient was advised to call back or seek an in-person evaluation if the symptoms worsen or if the condition fails to improve as anticipated.  I provided 35 minutes of non-face-to-face time during this encounter.   Noemi Chapel, NP

## 2022-07-09 NOTE — Patient Instructions (Signed)
Your sleep study revealed mild sleep apnea. We discussed how untreated sleep apnea puts an individual at risk for cardiac arrhthymias, pulm HTN, DM, stroke and increases their risk for daytime accidents. We also briefly reviewed treatment options including weight loss, side sleeping position, oral appliance, CPAP therapy. You opted to move forward with weight loss, positional sleeping and oral appliance.  Referral to orthodontics  Start Ambien 5 mg At bedtime as needed for sleep. Take immediately before bed.  -Do not drive or operate heavy machinery after taking. Monitor mood closely and notify of any changes. We reviewed other potential side effects including unusual sleep behaviors to monitor for. Do not take with other sedating medications. May cause AM drowsiness. We also discussed that this can make sleep apnea worse so you should ensure that you are focusing on positional sleeping and using oral appliance after taking.   Follow up in 6 weeks with Dr. Wynona Neat (1st) or Katie Janayia Burggraf,NP to see how new medication is working. If symptoms worsen, please contact office for sooner follow up or seek emergency care.

## 2022-07-09 NOTE — Assessment & Plan Note (Signed)
Longstanding history of insomnia. Previously on Ambien. Continues to have difficulties primarily with sleep latency. Reviewed sleep hygiene. Discussed non-pharmacological therapy and reviewed potential pharmacological therapy options. Shared decision to restart Ambien. Side effect profile reviewed. Cautioned on risks of sedatives with OSA. Verbalized understanding. See above.

## 2022-07-09 NOTE — Assessment & Plan Note (Signed)
Mild OSA with AHI 5.7/h. Reviewed risks of untreated OSA and potential treatment options. She is actively working on weight loss measures. She would like to move forward with oral appliance; referral to orthodontics placed today. She will focus on positional sleeping in the interim. Cautioned on safe driving practices.  Patient Instructions  Your sleep study revealed mild sleep apnea. We discussed how untreated sleep apnea puts an individual at risk for cardiac arrhthymias, pulm HTN, DM, stroke and increases their risk for daytime accidents. We also briefly reviewed treatment options including weight loss, side sleeping position, oral appliance, CPAP therapy. You opted to move forward with weight loss, positional sleeping and oral appliance.  Referral to orthodontics  Start Ambien 5 mg At bedtime as needed for sleep. Take immediately before bed.  -Do not drive or operate heavy machinery after taking. Monitor mood closely and notify of any changes. We reviewed other potential side effects including unusual sleep behaviors to monitor for. Do not take with other sedating medications. May cause AM drowsiness. We also discussed that this can make sleep apnea worse so you should ensure that you are focusing on positional sleeping and using oral appliance after taking.   Follow up in 6 weeks with Dr. Wynona Neat (1st) or Katie Arlyne Brandes,NP to see how new medication is working. If symptoms worsen, please contact office for sooner follow up or seek emergency care.

## 2022-07-24 NOTE — Progress Notes (Signed)
NEUROLOGY FOLLOW UP OFFICE NOTE  ARIEN Velez [id number]  Subjective:  Tammy Velez is a 57 y.o. year old right-handed female with a medical history of HLD, IBS, asthma, hypothyroidism, kidney stones who we last saw on 05/01/22.  To briefly review: Patient started having "migraines" when going through menopause (~2017). They were located on the right side, with pressure behind the eyes, with photophobia and phonophobia, and dizziness when standing. She denies nausea or vomiting. She would have maybe 1 per month. Over the last year (2023), they were more frequent, about 1 headache every 1-2 weeks.    Over the last 6 months (beginning at the end of 2023), they also became more intense and frequent. She also noticed post-orgasmic headache with a sharp pain on the right side of head that would last 20-30 seconds followed by an intense typically migraine. They now happen even without orgasm. She can have word finding problems, blurry vision. She does not have clear vision loss, but does think she requires more light to see than previous.   She denies any headache aura.   She prefers to lay in a cold, dark, room when she gets a headache. She denies worsening of symptoms with position (stay the same when she lays down). Her headaches do not get worse with baring down (such as with bowel movements). She denies jaw claudication or loss of vision.   She takes ibuprofen daily for headaches. She has never been on headache medications.   Patient has a family history of cerebral aneurysm. Paternal grandfather died from brain aneurysm in his 96s.   MRI brain on 03/18/22 showed a partially empty sella. 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   Patient takes pellets testosterone and estrogen.    She drinks 1 regular cup of coffee daily. She will occasionally drink a soda. She drinks 2 cocktails per week. She does not smoke.   She takes vit D and a probiotic.    She saw her eye doctor about 1 year ago and is planning to go again soon.  05/01/22: B12 was borderline low (256), so supplementation was recommended (1000 mcg daily). She is taking that.   ESR was elevated to 52.    Patient has had 1 headache since last visit. She is only on Nortriptyline 10 mg qhs. She never went to 20 mg. When she had one headache, she took imitrex and headache quickly resolved. She has not had any headaches with orgasm.   Patient has appointment with her eye doctor on Monday.   Patient mentions minor cognitive difficulties. She endorses poor sleep. She does not feel rested when she wakes. She feels very tired in the afternoon. She does snore. She has never had a sleep study.  Most recent Assessment and Plan (05/01/22): This is Tammy Velez, a 57 y.o. 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 She has had 1 headache in the last month since starting nortriptyline. The headache resolved quickly with sumatriptan.   Plan: -Repeat vessel imaging ~09/2022 -Continue B12 1000 mcg daily -Migraine prevention:  Nortriptyline 10 mg qhs -Migraine rescue:  Sumatriptan 100 mg PRN -Limit use of pain relievers to no more than 2 days out of week to prevent risk of rebound or medication-overuse headache. -Keep headache diary   -Sleep study for possible sleep apnea  Since their last visit: Patient was seen by Dr. Lucretia Roers in ophtho who found possible papilledema and recommended LP with opening pressure. Patient had LP  on 05/29/22. Despite not thinking she had a headache, she felt immediate relief of head pressure and vision was brighter after LP. Her opening pressure was 35. 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 We agreed to stop nortriptyline and start Diamox. Patient was unable to tolerate Diamox due to nausea. We stopped this and started Topamax on 06/04/22 (25 mg BID).  Patient called describing floaters and a metallic smell or engine exhaust smell. She also endorsed fluctuating  headaches. We discussed medication changes, but patient preferred to monitor. Patient went on vacation about 2-3 weeks ago and her symptoms resolved.   Patient had a bad headache about 2 weeks ago. It did not respond to sumatriptan x2. Patient has only had about 3 headaches since she was last seen in clinic (about 1 per month). She thinks baring down makes the headache worse, but not so much when laying down. Her vision is darker than after when she first had her LP.  Her sleep study showed mild OSA. She had also lost 15 pounds, but gained it back since vacation.   MEDICATIONS:  Outpatient Encounter Medications as of 08/06/2022  Medication Sig   albuterol (PROVENTIL HFA;VENTOLIN HFA) 108 (90 BASE) MCG/ACT inhaler Inhale 1-2 puffs into the lungs every 6 (six) hours as needed for wheezing or shortness of breath.    atorvastatin (LIPITOR) 40 MG tablet Take 1 tablet (40 mg total) by mouth daily. Please call our office to schedule an overdue appointment with Dr. Mayford Knife before anymore refills. [phone]. Thank you 2nd attempt (Patient taking differently: Take 20 mg by mouth daily. Please call our office to schedule an overdue appointment with Dr. Mayford Knife before anymore refills. [phone]. Thank you 2nd attempt)   liothyronine (CYTOMEL) 5 MCG tablet Take 10 mcg by mouth daily.   Naproxen (NAPROSYN PO) Take 1 capsule by mouth 3 (three) times daily as needed.   SUMAtriptan (IMITREX) 100 MG tablet Take 1 tablet (100 mg total) by mouth once as needed for up to 1 dose for migraine. May repeat in 2 hours if headache persists or recurs.   Testosterone 100 MG PLLT Take 100 mg by mouth See admin instructions.   Testosterone 50 MG PLLT Take 50 mg by mouth See admin instructions.   topiramate (TOPAMAX) 25 MG tablet Take 1 tablet (25 mg total) by mouth 2 (two) times daily. Take 1 tablet (25 mg) daily for 1 week, then increase to 1 tablet (25 mg) twice daily thereafter.   zolpidem (AMBIEN) 5 MG tablet Take 1 tablet  (5 mg total) by mouth at bedtime as needed for sleep.   ESTRADIOL PO Take 10 mg by mouth See admin instructions. (Patient not taking: Reported on 05/21/2022)   furosemide (LASIX) 20 MG tablet Take 1 tablet (20 mg total) by mouth as needed for edema. (Patient not taking: Reported on 07/09/2022)   ondansetron (ZOFRAN-ODT) 4 MG disintegrating tablet Take 1 tablet (4 mg total) by mouth every 8 (eight) hours as needed for nausea or vomiting.   SEMAGLUTIDE-WEIGHT MANAGEMENT [place] Inject 1 Dose into the skin once a week.   No facility-administered encounter medications on file as of 08/06/2022.    PAST MEDICAL HISTORY: Past Medical History:  Diagnosis Date   Asthma    Allegery induced   Complication of anesthesia 11/05/11    Pt re-intubated in OR due to residual muscle weakness   Constipation    Coronary artery calcification seen on CAT scan    Cough 07/21/2010   Dysmenorrhea 01/31/2016  Eczema    Headache    migraine   History of kidney stones    Hyperlipidemia    Insulin-resistant diabetes mellitus and acanthosis nigricans    diet controlled does not do CBGs   Left ureteral calculus    Nausea & vomiting    Upper respiratory infection    Ureter, calculus 11/05/2011   URI (upper respiratory infection) 03/10/2011    PAST SURGICAL HISTORY: Past Surgical History:  Procedure Laterality Date   BACK SURGERY  02/02/14   L5- S1 discectomy   CESAREAN SECTION  2002 &  2005   x2   CHOLECYSTECTOMY N/A 10/06/2014   Procedure: LAPAROSCOPIC CHOLECYSTECTOMY WITH INTRAOPERATIVE CHOLANGIOGRAM;  Surgeon: Chevis Pretty III, MD;  Location: [facility];  Service: General;  Laterality: N/A;   CYSTO/ RIGHT RETROGRADE PYELOGRAM/ RIGHT URETERAL STENT PLACEMENT  10-28-2010   RIGHT RENAL STONE   CYSTOSCOPY WITH URETEROSCOPY  11/05/2011   Procedure: CYSTOSCOPY WITH URETEROSCOPY;  Surgeon: Valetta Fuller, MD;  Location: [facility];  Service: Urology;  Laterality: Left;  URETEROSCOPY , STONE EXTRACTION POSSIBLE  BASKET RETROGRADE AND HOLMIUM LASER AND POSSIBLE STENT   C ARM  LASER    DILITATION & CURRETTAGE/HYSTROSCOPY WITH NOVASURE ABLATION N/A 09/09/2013   Procedure: DILATATION & CURETTAGE/HYSTEROSCOPY WITH NOVASURE ABLATION;  Surgeon: Lenoard Aden, MD;  Location: [facility];  Service: Gynecology;  Laterality: N/A;   ROBOTIC ASSISTED TOTAL HYSTERECTOMY WITH SALPINGECTOMY Bilateral 01/31/2016   Procedure: ROBOTIC ASSISTED TOTAL HYSTERECTOMY WITH SALPINGECTOMY;  Surgeon: Olivia Mackie, MD;  Location: [facility];  Service: Gynecology;  Laterality: Bilateral;    ALLERGIES: No Known Allergies  FAMILY HISTORY: Family History  Problem Relation Age of Onset   Heart disease Mother    Atrial fibrillation Mother    Mitral valve prolapse Mother     SOCIAL HISTORY: Social History   Tobacco Use   Smoking status: Never   Smokeless tobacco: Never  Vaping Use   Vaping Use: Never used  Substance Use Topics   Alcohol use: No    Alcohol/week: 3.0 standard drinks of alcohol    Types: 2 Glasses of wine, 1 Standard drinks or equivalent per week    Comment: ocassional   Drug use: No   Social History   Social History Narrative   Are you right handed or left handed? Right   Are you currently employed ? 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   Who lives with you? Husband and 2 children   What type of home do you live in: 1 story or 2 story? two   Caffeine 1 cup       Objective:  Vital Signs:  BP 126/83   Pulse 89   Ht 5\' 4"  (1.626 m)   Wt 266 lb (120.7 kg)   LMP 01/24/2016 (Exact Date)   SpO2 95%   BMI 45.66 kg/m   General: No acute distress.  Patient appears well-groomed.   Head:  Normocephalic/atraumatic Eyes:  fundi examined. Fundus margins are mildly blurred, but not perfectly seen Neck: supple Lungs: Non-labored breathing on room air   Neurological Exam: Mental status: alert and oriented, speech fluent and not dysarthric, language  intact.  Cranial nerves: CN I: not tested CN II: pupils equal, round and reactive to light, visual fields intact CN III, IV, VI:  full range of motion, no nystagmus, no ptosis CN V: facial sensation intact. CN VII: upper and lower face symmetric CN  VIII: hearing intact CN IX, X: uvula midline CN XI: sternocleidomastoid and trapezius muscles intact CN XII: tongue midline  Bulk & Tone: normal, no fasciculations. Motor:  muscle strength 5/5 throughout Deep Tendon Reflexes:  2+ throughout Sensation:  Light touch sensation intact. Finger to nose testing:  Without dysmetria.   Gait:  Normal station and stride.  Romberg negative.  Labs and Imaging review: New results: CSF 05/29/22: Opening pressure 35 0 RBC, 0 WBC, 46 protein, 61 glucose Myelin basic protein wnl ACE wnl IgG index normal  Previously reviewed results: 04/02/22: ESR: 52 B12: 256   Recent Labs           Lab Results  Component Value Date    HGBA1C 5.4 06/02/2017        Recent Labs[] Expand by Default           Lab Results  Component Value Date    TSH 2.500 06/02/2017        External labs: CMP (02/25/22): unremarkable HbA1c (02/22/21): 5.6 TSH( 03/2022): 1.687   Imaging: MRI/MRA brain wo contrast (03/15/22): FINDINGS: MRI HEAD FINDINGS   Brain: There is no evidence of an acute infarct, intracranial hemorrhage, mass, midline shift, or extra-axial fluid collection. The ventricles and sulci are normal. No significant white matter disease is evident. An incidental developmental venous anomaly is noted in the right basal ganglia. There is a partially empty sella. The cerebellar tonsils are normally positioned.   Vascular: Major intracranial vascular flow voids are preserved.   Skull and upper cervical spine: Unremarkable bone marrow signal.   Sinuses/Orbits: Unremarkable orbits. No significant inflammatory changes in the paranasal sinuses. Small right mastoid effusion.   Other: None.   MRA HEAD  FINDINGS   Anterior circulation: The internal carotid arteries are widely patent from skull base to carotid termini. There is a 2 mm protrusion from the right supraclinoid ICA in the posterior communicating region. The ACAs and MCAs are patent without evidence of a proximal branch occlusion or significant proximal stenosis.   Posterior circulation: The included distal portions of the intracranial vertebral arteries are patent to the basilar. The basilar artery is widely patent. Patent SCA origins are seen bilaterally. There may be diminutive posterior communicating arteries bilaterally. Both PCAs are patent without evidence of a significant proximal stenosis. No aneurysm is identified.   Anatomic variants: None.   IMPRESSION: 1. Partially empty sella, often an incidental finding though can be seen with intracranial hypertension. 2. Otherwise unremarkable appearance of the brain. 3. 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 4. Otherwise unremarkable head MRA.  Assessment/Plan:  This is LIESHA CLINTON, a 57 y.o. 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   Patient had LP on 05/29/22. Despite not thinking she had a headache, she felt immediate relief of head pressure and vision was brighter after LP. Her opening pressure was 35. 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 We agreed to stop nortriptyline and start Diamox. Patient was unable to tolerate Diamox due to nausea. We stopped this and started Topamax on 06/04/22 (25 mg BID). She has only had 3 headaches since last clinic visit, one being not responsive to sumatriptan. She does think vision is getting darker again.  Plan: -Increase Topamax to 50 mg BID -Continue Sumatriptan 100 mg PRN at onset of headache. Can repeat after 2 hours -Limit use of pain relievers to no more than 2 days out of week  to prevent risk of rebound or  medication-overuse headache.   -CTA of head w/wo contrast (aneurysm monitoring)  Return to clinic in 6 months  Jacquelyne Balint, MD

## 2022-08-06 ENCOUNTER — Ambulatory Visit: Payer: BC Managed Care – PPO | Admitting: Neurology

## 2022-08-06 ENCOUNTER — Encounter: Payer: Self-pay | Admitting: Neurology

## 2022-08-06 VITALS — BP 126/83 | HR 89 | Ht 64.0 in | Wt 266.0 lb

## 2022-08-06 DIAGNOSIS — H538 Other visual disturbances: Secondary | ICD-10-CM

## 2022-08-06 DIAGNOSIS — Z72820 Sleep deprivation: Secondary | ICD-10-CM

## 2022-08-06 DIAGNOSIS — H53149 Visual discomfort, unspecified: Secondary | ICD-10-CM

## 2022-08-06 DIAGNOSIS — G932 Benign intracranial hypertension: Secondary | ICD-10-CM | POA: Diagnosis not present

## 2022-08-06 DIAGNOSIS — Z8249 Family history of ischemic heart disease and other diseases of the circulatory system: Secondary | ICD-10-CM

## 2022-08-06 DIAGNOSIS — R0683 Snoring: Secondary | ICD-10-CM

## 2022-08-06 DIAGNOSIS — F40298 Other specified phobia: Secondary | ICD-10-CM

## 2022-08-06 DIAGNOSIS — I671 Cerebral aneurysm, nonruptured: Secondary | ICD-10-CM

## 2022-08-06 DIAGNOSIS — G43009 Migraine without aura, not intractable, without status migrainosus: Secondary | ICD-10-CM | POA: Diagnosis not present

## 2022-08-06 MED ORDER — TOPIRAMATE 25 MG PO TABS
50.0000 mg | ORAL_TABLET | Freq: Two times a day (BID) | ORAL | 11 refills | Status: DC
Start: 2022-08-06 — End: 2023-02-13

## 2022-08-06 NOTE — Patient Instructions (Addendum)
We will repeat your vessel imaging with a CTA to monitor the possible aneurysm.  Increase your topamax to 50 mg twice daily.  Continue Sumatriptan 100 mg as needed at onset of headache. Can repeat after 2 hours  Limit use of pain relievers to no more than 2 days out of week to prevent risk of rebound or medication-overuse headache.   I will be in touch when I have the results of your CTA.  Please have your eye doctor send me the results of their dilated fundoscopy as we are keeping an eye on your papilledema.  I will see you back in clinic in 6 months or sooner if needed. Please let me know if you have any questions or concerns in the meantime.   The physicians and staff at [facility] are committed to providing excellent care. You may receive a survey requesting feedback about your experience at our office. We strive to receive "very good" responses to the survey questions. If you feel that your experience would prevent you from giving the office a "very good " response, please contact our office to try to remedy the situation. We may be reached at [phone]. Thank you for taking the time out of your busy day to complete the survey.  Jacquelyne Balint, MD [facility]

## 2022-09-03 ENCOUNTER — Ambulatory Visit
Admission: RE | Admit: 2022-09-03 | Discharge: 2022-09-03 | Disposition: A | Discharge status: Home / Self Care | Payer: BC Managed Care – PPO | Source: Ambulatory Visit | Attending: Neurology | Admitting: Neurology

## 2022-09-03 DIAGNOSIS — G932 Benign intracranial hypertension: Secondary | ICD-10-CM

## 2022-09-03 DIAGNOSIS — R0683 Snoring: Secondary | ICD-10-CM

## 2022-09-03 DIAGNOSIS — F40298 Other specified phobia: Secondary | ICD-10-CM

## 2022-09-03 DIAGNOSIS — Z8249 Family history of ischemic heart disease and other diseases of the circulatory system: Secondary | ICD-10-CM

## 2022-09-03 DIAGNOSIS — H538 Other visual disturbances: Secondary | ICD-10-CM

## 2022-09-03 DIAGNOSIS — G43009 Migraine without aura, not intractable, without status migrainosus: Secondary | ICD-10-CM

## 2022-09-03 DIAGNOSIS — H53149 Visual discomfort, unspecified: Secondary | ICD-10-CM

## 2022-09-03 DIAGNOSIS — Z72820 Sleep deprivation: Secondary | ICD-10-CM

## 2022-09-03 DIAGNOSIS — I671 Cerebral aneurysm, nonruptured: Secondary | ICD-10-CM

## 2022-09-03 MED ORDER — IOPAMIDOL (ISOVUE-370) INJECTION 76%
75.0000 mL | Freq: Once | INTRAVENOUS | Status: AC | PRN
  Administered 2022-09-03: 75 mL via INTRAVENOUS

## 2022-09-09 ENCOUNTER — Other Ambulatory Visit: Payer: Self-pay

## 2022-09-09 ENCOUNTER — Encounter: Payer: Self-pay | Admitting: Neurology

## 2022-09-09 NOTE — Progress Notes (Unsigned)
Hi Tammy Velez this is , Dr. Jorge Ny nurse. Your results have not been release at this time we will call you as soon as the results are released. Have a good day.

## 2022-09-11 ENCOUNTER — Other Ambulatory Visit: Payer: Self-pay

## 2022-09-12 ENCOUNTER — Other Ambulatory Visit: Payer: Self-pay

## 2022-10-17 NOTE — Progress Notes (Deleted)
NEUROLOGY FOLLOW UP OFFICE NOTE  Tammy Velez [id number]  Subjective:  Tammy Velez is a 57 y.o. year old right-handed female with a medical history of HLD, IBS, asthma, hypothyroidism, kidney stones who we last saw on 08/06/22 for IIH.  To briefly review: 04/02/22: Patient started having "migraines" when going through menopause (~2017). They were located on the right side, with pressure behind the eyes, with photophobia and phonophobia, and dizziness when standing. She denies nausea or vomiting. She would have maybe 1 per month. Over the last year (2023), they were more frequent, about 1 headache every 1-2 weeks.    Over the last 6 months (beginning at the end of 2023), they also became more intense and frequent. She also noticed post-orgasmic headache with a sharp pain on the right side of head that would last 20-30 seconds followed by an intense typically migraine. They now happen even without orgasm. She can have word finding problems, blurry vision. She does not have clear vision loss, but does think she requires more light to see than previous.   She denies any headache aura.   She prefers to lay in a cold, dark, room when she gets a headache. She denies worsening of symptoms with position (stay the same when she lays down). Her headaches do not get worse with baring down (such as with bowel movements). She denies jaw claudication or loss of vision.   She takes ibuprofen daily for headaches. She has never been on headache medications.   Patient has a family history of cerebral aneurysm. Paternal grandfather died from brain aneurysm in his 73s.   MRI brain on 03/18/22 showed a partially empty sella. 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   Patient takes pellets testosterone and estrogen.    She drinks 1 regular cup of coffee daily. She will occasionally drink a soda. She drinks 2 cocktails per week. She does not smoke.   She takes vit D and a  probiotic.   She saw her eye doctor about 1 year ago and is planning to go again soon.   05/01/22: B12 was borderline low (256), so supplementation was recommended (1000 mcg daily). She is taking that.   ESR was elevated to 52.    Patient has had 1 headache since last visit. She is only on Nortriptyline 10 mg qhs. She never went to 20 mg. When she had one headache, she took imitrex and headache quickly resolved. She has not had any headaches with orgasm.   Patient has appointment with her eye doctor on Monday.   Patient mentions minor cognitive difficulties. She endorses poor sleep. She does not feel rested when she wakes. She feels very tired in the afternoon. She does snore. She has never had a sleep study.  08/06/22: Patient was seen by Dr. Lucretia Roers in ophtho who found possible papilledema and recommended LP with opening pressure. Patient had LP on 05/29/22. Despite not thinking she had a headache, she felt immediate relief of head pressure and vision was brighter after LP. Her opening pressure was 35. 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 We agreed to stop nortriptyline and start Diamox. Patient was unable to tolerate Diamox due to nausea. We stopped this and started Topamax on 06/04/22 (25 mg BID).   Patient called describing floaters and a metallic smell or engine exhaust smell. She also endorsed fluctuating headaches. We discussed medication changes, but patient preferred to monitor. Patient went on vacation about 2-3  weeks ago and her symptoms resolved.    Patient had a bad headache about 2 weeks ago. It did not respond to sumatriptan x2. Patient has only had about 3 headaches since she was last seen in clinic (about 1 per month). She thinks baring down makes the headache worse, but not so much when laying down. Her vision is darker than after when she first had her LP.   Her sleep study showed mild OSA. She had also lost 15 pounds, but gained it back since vacation.  Most recent  Assessment and Plan (08/06/22): This is Tammy Velez, a 57 y.o. 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    Patient had LP on 05/29/22. Despite not thinking she had a headache, she felt immediate relief of head pressure and vision was brighter after LP. Her opening pressure was 35. 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 We agreed to stop nortriptyline and start Diamox. Patient was unable to tolerate Diamox due to nausea. We stopped this and started Topamax on 06/04/22 (25 mg BID). She has only had 3 headaches since last clinic visit, one being not responsive to sumatriptan. She does think vision is getting darker again.   Plan: -Increase Topamax to 50 mg BID -Continue Sumatriptan 100 mg PRN at onset of headache. Can repeat after 2 hours -Limit use of pain relievers to no more than 2 days out of week to prevent risk of rebound or medication-overuse headache.    -CTA of head w/wo contrast (aneurysm monitoring)  Since their last visit: ***  CTA of head for aneurysm monitoring was unchanged from prior.***  MEDICATIONS:  Outpatient Encounter Medications as of 10/24/2022  Medication Sig   albuterol (PROVENTIL HFA;VENTOLIN HFA) 108 (90 BASE) MCG/ACT inhaler Inhale 1-2 puffs into the lungs every 6 (six) hours as needed for wheezing or shortness of breath.    atorvastatin (LIPITOR) 40 MG tablet Take 1 tablet (40 mg total) by mouth daily. Please call our office to schedule an overdue appointment with Dr. Mayford Knife before anymore refills. [phone]. Thank you 2nd attempt (Patient taking differently: Take 20 mg by mouth daily. Please call our office to schedule an overdue appointment with Dr. Mayford Knife before anymore refills. [phone]. Thank you 2nd attempt)   ESTRADIOL PO Take 10 mg by mouth See admin instructions. (Patient not taking: Reported on 05/21/2022)   furosemide (LASIX) 20 MG tablet Take 1 tablet (20 mg total)  by mouth as needed for edema. (Patient not taking: Reported on 07/09/2022)   liothyronine (CYTOMEL) 5 MCG tablet Take 10 mcg by mouth daily.   Naproxen (NAPROSYN PO) Take 1 capsule by mouth 3 (three) times daily as needed.   SUMAtriptan (IMITREX) 100 MG tablet Take 1 tablet (100 mg total) by mouth once as needed for up to 1 dose for migraine. May repeat in 2 hours if headache persists or recurs.   Testosterone 100 MG PLLT Take 100 mg by mouth See admin instructions.   Testosterone 50 MG PLLT Take 50 mg by mouth See admin instructions.   topiramate (TOPAMAX) 25 MG tablet Take 2 tablets (50 mg total) by mouth 2 (two) times daily. Take 1 tablet (25 mg) daily for 1 week, then increase to 1 tablet (25 mg) twice daily thereafter.   zolpidem (AMBIEN) 5 MG tablet Take 1 tablet (5 mg total) by mouth at bedtime as needed for sleep.   No facility-administered encounter medications on  file as of 10/24/2022.    PAST MEDICAL HISTORY: Past Medical History:  Diagnosis Date   Asthma    Allegery induced   Complication of anesthesia 11/05/11    Pt re-intubated in OR due to residual muscle weakness   Constipation    Coronary artery calcification seen on CAT scan    Cough 07/21/2010   Dysmenorrhea 01/31/2016   Eczema    Headache    migraine   History of kidney stones    Hyperlipidemia    Insulin-resistant diabetes mellitus and acanthosis nigricans    diet controlled does not do CBGs   Left ureteral calculus    Nausea & vomiting    Upper respiratory infection    Ureter, calculus 11/05/2011   URI (upper respiratory infection) 03/10/2011    PAST SURGICAL HISTORY: Past Surgical History:  Procedure Laterality Date   BACK SURGERY  02/02/14   L5- S1 discectomy   CESAREAN SECTION  2002 &  2005   x2   CHOLECYSTECTOMY N/A 10/06/2014   Procedure: LAPAROSCOPIC CHOLECYSTECTOMY WITH INTRAOPERATIVE CHOLANGIOGRAM;  Surgeon: Chevis Pretty III, MD;  Location: [facility] OR;  Service: General;  Laterality: N/A;   CYSTO/ RIGHT  RETROGRADE PYELOGRAM/ RIGHT URETERAL STENT PLACEMENT  10-28-2010   RIGHT RENAL STONE   CYSTOSCOPY WITH URETEROSCOPY  11/05/2011   Procedure: CYSTOSCOPY WITH URETEROSCOPY;  Surgeon: Valetta Fuller, MD;  Location: [facility];  Service: Urology;  Laterality: Left;  URETEROSCOPY , STONE EXTRACTION POSSIBLE BASKET RETROGRADE AND HOLMIUM LASER AND POSSIBLE STENT   C ARM  LASER    DILITATION & CURRETTAGE/HYSTROSCOPY WITH NOVASURE ABLATION N/A 09/09/2013   Procedure: DILATATION & CURETTAGE/HYSTEROSCOPY WITH NOVASURE ABLATION;  Surgeon: Lenoard Aden, MD;  Location: [facility];  Service: Gynecology;  Laterality: N/A;   ROBOTIC ASSISTED TOTAL HYSTERECTOMY WITH SALPINGECTOMY Bilateral 01/31/2016   Procedure: ROBOTIC ASSISTED TOTAL HYSTERECTOMY WITH SALPINGECTOMY;  Surgeon: Olivia Mackie, MD;  Location: [facility];  Service: Gynecology;  Laterality: Bilateral;    ALLERGIES: No Known Allergies  FAMILY HISTORY: Family History  Problem Relation Age of Onset   Heart disease Mother    Atrial fibrillation Mother    Mitral valve prolapse Mother     SOCIAL HISTORY: Social History   Tobacco Use   Smoking status: Never   Smokeless tobacco: Never  Vaping Use   Vaping status: Never Used  Substance Use Topics   Alcohol use: No    Alcohol/week: 3.0 standard drinks of alcohol    Types: 2 Glasses of wine, 1 Standard drinks or equivalent per week    Comment: ocassional   Drug use: No   Social History   Social History Narrative   Are you right handed or left handed? Right   Are you currently employed ? 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   Who lives with you? Husband and 2 children   What type of home do you live in: 1 story or 2 story? two   Caffeine 1 cup       Objective:  Vital Signs:  LMP 01/24/2016 (Exact Date)   ***  Labs and Imaging review: New results: FINDINGS: CT HEAD   Brain: There is no mass, hemorrhage or extra-axial  collection. The size and configuration of the ventricles and extra-axial CSF spaces are normal. The brain parenchyma is normal, without acute or chronic infarction.   Vascular: No abnormal hyperdensity of the major intracranial arteries or dural venous sinuses.  No intracranial atherosclerosis.   Skull: The visualized skull base, calvarium and extracranial soft tissues are normal.   Sinuses/Orbits: No fluid levels or advanced mucosal thickening of the visualized paranasal sinuses. No mastoid or middle ear effusion. The orbits are normal.   CTA HEAD   POSTERIOR CIRCULATION:   --Vertebral arteries: Normal   --Inferior cerebellar arteries: Normal.   --Basilar artery: Normal.   --Superior cerebellar arteries: Normal.   --Posterior cerebral arteries: Normal.   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 There is mild atherosclerotic calcification of both internal carotid arteries.   --Anterior cerebral arteries (ACA): Normal.   --Middle cerebral arteries (MCA): Normal.   Review of the MIP images confirms the above findings.   IMPRESSION: 1. No emergent large vessel occlusion or high-grade stenosis of the intracranial arteries. 2. 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  Previously reviewed results: CSF 05/29/22: Opening pressure 35 0 RBC, 0 WBC, 46 protein, 61 glucose Myelin basic protein wnl ACE wnl IgG index normal   04/02/22: ESR: 52 B12: 256   Recent Labs           Lab Results  Component Value Date    HGBA1C 5.4 06/02/2017        Recent Labs[] Expand by Default           Lab Results  Component Value Date    TSH 2.500 06/02/2017        External labs: CMP (02/25/22): unremarkable HbA1c (02/22/21): 5.6 TSH( 03/2022): 1.687   MRI/MRA brain wo contrast (03/15/22): FINDINGS: MRI HEAD FINDINGS    Brain: There is no evidence of an acute infarct, intracranial hemorrhage, mass, midline shift, or extra-axial fluid collection. The ventricles and sulci are normal. No significant white matter disease is evident. An incidental developmental venous anomaly is noted in the right basal ganglia. There is a partially empty sella. The cerebellar tonsils are normally positioned.   Vascular: Major intracranial vascular flow voids are preserved.   Skull and upper cervical spine: Unremarkable bone marrow signal.   Sinuses/Orbits: Unremarkable orbits. No significant inflammatory changes in the paranasal sinuses. Small right mastoid effusion.   Other: None.   MRA HEAD FINDINGS   Anterior circulation: The internal carotid arteries are widely patent from skull base to carotid termini. There is a 2 mm protrusion from the right supraclinoid ICA in the posterior communicating region. The ACAs and MCAs are patent without evidence of a proximal branch occlusion or significant proximal stenosis.   Posterior circulation: The included distal portions of the intracranial vertebral arteries are patent to the basilar. The basilar artery is widely patent. Patent SCA origins are seen bilaterally. There may be diminutive posterior communicating arteries bilaterally. Both PCAs are patent without evidence of a significant proximal stenosis. No aneurysm is identified.   Anatomic variants: None.   IMPRESSION: 1. Partially empty sella, often an incidental finding though can be seen with intracranial hypertension. 2. Otherwise unremarkable appearance of the brain. 3. 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 4. Otherwise unremarkable head MRA.  Assessment/Plan:  This is Tammy Velez, a 57 y.o. 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   Patient had LP on 05/29/22. Despite not thinking she had a headache,  she felt immediate relief of head pressure and vision was brighter after LP. Her opening  pressure was 35. 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 We agreed to stop nortriptyline and start Diamox. Patient was unable to tolerate Diamox due to nausea. We stopped this and started Topamax on 06/04/22. ***  Plan: *** -Continue Topamax to 50 mg BID -Continue Sumatriptan 100 mg PRN at onset of headache. Can repeat after 2 hours -Limit use of pain relievers to no more than 2 days out of week to prevent risk of rebound or medication-overuse headache. -Will repeat CTA head for aneurysm monitoring in 1 year  Return to clinic in ***  Total time spent reviewing records, interview, history/exam, documentation, and coordination of care on day of encounter:  *** min  Jacquelyne Balint, MD

## 2022-10-24 ENCOUNTER — Ambulatory Visit: Payer: BC Managed Care – PPO | Admitting: Neurology

## 2023-02-05 NOTE — Progress Notes (Signed)
 NEUROLOGY FOLLOW UP OFFICE NOTE  Tammy Velez [id number]  Subjective:  Tammy Velez is a 58 y.o. year old right-handed female with a medical history of HLD, IBS, asthma, hypothyroidism, kidney stones who we last saw on 08/06/22 for IIH.  To briefly review: 04/02/22: Patient started having migraines when going through menopause (~2017). They were located on the right side, with pressure behind the eyes, with photophobia and phonophobia, and dizziness when standing. She denies nausea or vomiting. She would have maybe 1 per month. Over the last year (2023), they were more frequent, about 1 headache every 1-2 weeks.    Over the last 6 months (beginning at the end of 2023), they also became more intense and frequent. She also noticed post-orgasmic headache with a sharp pain on the right side of head that would last 20-30 seconds followed by an intense typically migraine. They now happen even without orgasm. She can have word finding problems, blurry vision. She does not have clear vision loss, but does think she requires more light to see than previous.   She denies any headache aura.   She prefers to lay in a cold, dark, room when she gets a headache. She denies worsening of symptoms with position (stay the same when she lays down). Her headaches do not get worse with baring down (such as with bowel movements). She denies jaw claudication or loss of vision.   She takes ibuprofen daily for headaches. She has never been on headache medications.   Patient has a family history of cerebral aneurysm. Paternal grandfather died from brain aneurysm in his 71s.   MRI brain on 03/18/22 showed a partially empty sella. 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   Patient takes pellets testosterone and estrogen.    She drinks 1 regular cup of coffee daily. She will occasionally drink a soda. She drinks 2 cocktails per week. She does not smoke.   She takes vit D and a  probiotic.   She saw her eye doctor about 1 year ago and is planning to go again soon.   05/01/22: B12 was borderline low (256), so supplementation was recommended (1000 mcg daily). She is taking that.   ESR was elevated to 52.    Patient has had 1 headache since last visit. She is only on Nortriptyline  10 mg qhs. She never went to 20 mg. When she had one headache, she took imitrex  and headache quickly resolved. She has not had any headaches with orgasm.   Patient has appointment with her eye doctor on Monday.   Patient mentions minor cognitive difficulties. She endorses poor sleep. She does not feel rested when she wakes. She feels very tired in the afternoon. She does snore. She has never had a sleep study.   08/06/22: Patient was seen by Dr. Debarah in ophtho who found possible papilledema and recommended LP with opening pressure. Patient had LP on 05/29/22. Despite not thinking she had a headache, she felt immediate relief of head pressure and vision was brighter after LP. Her opening pressure was 35. 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 We agreed to stop nortriptyline  and start Diamox . Patient was unable to tolerate Diamox  due to nausea. We stopped this and started Topamax  on 06/04/22 (25 mg BID).   Patient called describing floaters and a metallic smell or engine exhaust smell. She also endorsed fluctuating headaches. We discussed medication changes, but patient preferred to monitor. Patient went on vacation about  2-3 weeks ago and her symptoms resolved.    Patient had a bad headache about 2 weeks ago. It did not respond to sumatriptan  x2. Patient has only had about 3 headaches since she was last seen in clinic (about 1 per month). She thinks baring down makes the headache worse, but not so much when laying down. Her vision is darker than after when she first had her LP.   Her sleep study showed mild OSA. She had also lost 15 pounds, but gained it back since vacation.  Most recent  Assessment and Plan (08/06/22): This is Tammy Velez, a 58 y.o. 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    Patient had LP on 05/29/22. Despite not thinking she had a headache, she felt immediate relief of head pressure and vision was brighter after LP. Her opening pressure was 35. 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 We agreed to stop nortriptyline  and start Diamox . Patient was unable to tolerate Diamox  due to nausea. We stopped this and started Topamax  on 06/04/22 (25 mg BID). She has only had 3 headaches since last clinic visit, one being not responsive to sumatriptan . She does think vision is getting darker again.   Plan: -Increase Topamax  to 50 mg BID -Continue Sumatriptan  100 mg PRN at onset of headache. Can repeat after 2 hours -Limit use of pain relievers to no more than 2 days out of week to prevent risk of rebound or medication-overuse headache.    -CTA of head w/wo contrast (aneurysm monitoring)  Since their last visit: She is getting about 2 headaches per month. They are severe when she gets them. She woke up with it today. Sumatriptan  resolves her headaches within a couple of hours. It makes her sleepy but when she wakes, she is a little hung over feeling, but no headache.  When she increased topamax  to 50 mg BID, she had tingling in her fingers and toes and swelling in them. She dropped the topamax  to 25 mg BID and the tingling stopped. She had a continued cough though. She stopped the topamax  at the end of 09/2022, then the cough went away.   She notices duller vision since being off the topamax , except when she has a headache and has photophobia (migraine).  Current medications:  Sumatriptan  100 mg PRN - couple times per month (no side effects)  CTA of head for aneurysm monitoring was unchanged from prior.  MEDICATIONS:  Outpatient Encounter Medications as of 02/13/2023  Medication Sig    albuterol  (PROVENTIL  HFA;VENTOLIN  HFA) 108 (90 BASE) MCG/ACT inhaler Inhale 1-2 puffs into the lungs every 6 (six) hours as needed for wheezing or shortness of breath.    atorvastatin  (LIPITOR) 40 MG tablet Take 1 tablet (40 mg total) by mouth daily. Please call our office to schedule an overdue appointment with Dr. Shlomo before anymore refills. [phone]. Thank you 2nd attempt (Patient taking differently: Take 20 mg by mouth daily. Please call our office to schedule an overdue appointment with Dr. Shlomo before anymore refills. [phone]. Thank you 2nd attempt)   furosemide  (LASIX ) 20 MG tablet Take 1 tablet (20 mg total) by mouth daily.   Naproxen (NAPROSYN PO) Take 1 capsule by mouth 3 (three) times daily as needed.   SUMAtriptan  (IMITREX ) 100 MG tablet Take 1 tablet (100 mg total) by mouth once as needed for up to 1 dose for migraine. May repeat in 2 hours if  headache persists or recurs.   Testosterone 100 MG PLLT Take 100 mg by mouth See admin instructions.   Testosterone 50 MG PLLT Take 50 mg by mouth See admin instructions.   ESTRADIOL PO Take 10 mg by mouth See admin instructions. (Patient not taking: Reported on 02/13/2023)   liothyronine (CYTOMEL) 5 MCG tablet Take 10 mcg by mouth daily. (Patient not taking: Reported on 02/13/2023)   zolpidem  (AMBIEN ) 5 MG tablet Take 1 tablet (5 mg total) by mouth at bedtime as needed for sleep. (Patient not taking: Reported on 02/13/2023)   [DISCONTINUED] furosemide  (LASIX ) 20 MG tablet Take 1 tablet (20 mg total) by mouth as needed for edema. (Patient not taking: Reported on 02/13/2023)   [DISCONTINUED] topiramate  (TOPAMAX ) 25 MG tablet Take 2 tablets (50 mg total) by mouth 2 (two) times daily. Take 1 tablet (25 mg) daily for 1 week, then increase to 1 tablet (25 mg) twice daily thereafter. (Patient not taking: Reported on 02/13/2023)   No facility-administered encounter medications on file as of 02/13/2023.    PAST MEDICAL HISTORY: Past Medical  History:  Diagnosis Date   Asthma    Allegery induced   Complication of anesthesia 11/05/11    Pt re-intubated in OR due to residual muscle weakness   Constipation    Coronary artery calcification seen on CAT scan    Cough 07/21/2010   Dysmenorrhea 01/31/2016   Eczema    Headache    migraine   History of kidney stones    Hyperlipidemia    Insulin-resistant diabetes mellitus and acanthosis nigricans    diet controlled does not do CBGs   Left ureteral calculus    Nausea & vomiting    Upper respiratory infection    Ureter, calculus 11/05/2011   URI (upper respiratory infection) 03/10/2011    PAST SURGICAL HISTORY: Past Surgical History:  Procedure Laterality Date   BACK SURGERY  02/02/14   L5- S1 discectomy   CESAREAN SECTION  2002 &  2005   x2   CHOLECYSTECTOMY N/A 10/06/2014   Procedure: LAPAROSCOPIC CHOLECYSTECTOMY WITH INTRAOPERATIVE CHOLANGIOGRAM;  Surgeon: Deward Null III, MD;  Location: [facility];  Service: General;  Laterality: N/A;   CYSTO/ RIGHT RETROGRADE PYELOGRAM/ RIGHT URETERAL STENT PLACEMENT  10-28-2010   RIGHT RENAL STONE   CYSTOSCOPY WITH URETEROSCOPY  11/05/2011   Procedure: CYSTOSCOPY WITH URETEROSCOPY;  Surgeon: Alm GORMAN Fragmin, MD;  Location: [facility];  Service: Urology;  Laterality: Left;  URETEROSCOPY , STONE EXTRACTION POSSIBLE BASKET RETROGRADE AND HOLMIUM LASER AND POSSIBLE STENT   C ARM  LASER    DILITATION & CURRETTAGE/HYSTROSCOPY WITH NOVASURE ABLATION N/A 09/09/2013   Procedure: DILATATION & CURETTAGE/HYSTEROSCOPY WITH NOVASURE ABLATION;  Surgeon: Charlie JINNY Flowers, MD;  Location: [facility];  Service: Gynecology;  Laterality: N/A;   ROBOTIC ASSISTED TOTAL HYSTERECTOMY WITH SALPINGECTOMY Bilateral 01/31/2016   Procedure: ROBOTIC ASSISTED TOTAL HYSTERECTOMY WITH SALPINGECTOMY;  Surgeon: Charlie Flowers, MD;  Location: [facility];  Service: Gynecology;  Laterality: Bilateral;    ALLERGIES: No Known Allergies  FAMILY HISTORY: Family History  Problem  Relation Age of Onset   Heart disease Mother    Atrial fibrillation Mother    Mitral valve prolapse Mother     SOCIAL HISTORY: Social History   Tobacco Use   Smoking status: Never   Smokeless tobacco: Never  Vaping Use   Vaping status: Never Used  Substance Use Topics   Alcohol use: No    Alcohol/week: 3.0 standard drinks of alcohol  Types: 2 Glasses of wine, 1 Standard drinks or equivalent per week    Comment: ocassional   Drug use: No   Social History   Social History Narrative   Are you right handed or left handed? Right   Are you currently employed ? 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   Who lives with you? Husband and 2 children   What type of home do you live in: 1 story or 2 story? two   Caffeine 1 cup       Objective:  Vital Signs:  BP 133/80   Pulse 85   Ht 5' 4 (1.626 m)   Wt 277 lb (125.6 kg)   LMP 01/24/2016 (Exact Date)   SpO2 95%   BMI 47.55 kg/m   General: No acute distress.  Patient appears well-groomed.   Head:  Normocephalic/atraumatic Eyes:  fundi examined but not able to be visualized Neck: supple Lungs: Non-labored breathing on room air   Neurological Exam: Mental status: alert and oriented, speech fluent and not dysarthric, language intact.  Cranial nerves: CN I: not tested CN II: pupils equal, round and reactive to light, visual fields intact CN III, IV, VI:  full range of motion, no nystagmus, no ptosis CN V: facial sensation intact. CN VII: upper and lower face symmetric CN VIII: hearing intact CN IX, X: uvula midline CN XI: sternocleidomastoid and trapezius muscles intact CN XII: tongue midline  Bulk & Tone: normal, no fasciculations. Motor:  muscle strength 5/5 throughout Deep Tendon Reflexes:  2+ throughout.   Sensation:  Light touch sensation intact. Finger to nose testing:  Without dysmetria.    Gait:  Normal station and stride.   Labs and Imaging review: New  results: CTA head (09/03/22): FINDINGS: CT HEAD   Brain: There is no mass, hemorrhage or extra-axial collection. The size and configuration of the ventricles and extra-axial CSF spaces are normal. The brain parenchyma is normal, without acute or chronic infarction.   Vascular: No abnormal hyperdensity of the major intracranial arteries or dural venous sinuses. No intracranial atherosclerosis.   Skull: The visualized skull base, calvarium and extracranial soft tissues are normal.   Sinuses/Orbits: No fluid levels or advanced mucosal thickening of the visualized paranasal sinuses. No mastoid or middle ear effusion. The orbits are normal.   CTA HEAD   POSTERIOR CIRCULATION:   --Vertebral arteries: Normal   --Inferior cerebellar arteries: Normal.   --Basilar artery: Normal.   --Superior cerebellar arteries: Normal.   --Posterior cerebral arteries: Normal.   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 There is mild atherosclerotic calcification of both internal carotid arteries.   --Anterior cerebral arteries (ACA): Normal.   --Middle cerebral arteries (MCA): Normal.   Review of the MIP images confirms the above findings.   IMPRESSION: 1. No emergent large vessel occlusion or high-grade stenosis of the intracranial arteries. 2. 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   Previously reviewed results: CSF 05/29/22: Opening pressure 35 0 RBC, 0 WBC, 46 protein, 61 glucose Myelin basic protein wnl ACE wnl IgG index normal   04/02/22: ESR: 52 B12: 256   Recent Labs           Lab Results  Component Value Date    HGBA1C 5.4 06/02/2017        Recent Labs[] Expand by [employer]  Lab Results  Component Value Date    TSH 2.500 06/02/2017        External labs: CMP (02/25/22): unremarkable HbA1c  (02/22/21): 5.6 TSH( 03/2022): 1.687   MRI/MRA brain wo contrast (03/15/22): FINDINGS: MRI HEAD FINDINGS   Brain: There is no evidence of an acute infarct, intracranial hemorrhage, mass, midline shift, or extra-axial fluid collection. The ventricles and sulci are normal. No significant white matter disease is evident. An incidental developmental venous anomaly is noted in the right basal ganglia. There is a partially empty sella. The cerebellar tonsils are normally positioned.   Vascular: Major intracranial vascular flow voids are preserved.   Skull and upper cervical spine: Unremarkable bone marrow signal.   Sinuses/Orbits: Unremarkable orbits. No significant inflammatory changes in the paranasal sinuses. Small right mastoid effusion.   Other: None.   MRA HEAD FINDINGS   Anterior circulation: The internal carotid arteries are widely patent from skull base to carotid termini. There is a 2 mm protrusion from the right supraclinoid ICA in the posterior communicating region. The ACAs and MCAs are patent without evidence of a proximal branch occlusion or significant proximal stenosis.   Posterior circulation: The included distal portions of the intracranial vertebral arteries are patent to the basilar. The basilar artery is widely patent. Patent SCA origins are seen bilaterally. There may be diminutive posterior communicating arteries bilaterally. Both PCAs are patent without evidence of a significant proximal stenosis. No aneurysm is identified.   Anatomic variants: None.   IMPRESSION: 1. Partially empty sella, often an incidental finding though can be seen with intracranial hypertension. 2. Otherwise unremarkable appearance of the brain. 3. 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 4. Otherwise unremarkable head MRA.  Assessment/Plan:  This is Tammy Velez, a 58 y.o. 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   Patient had LP on 05/29/22. Despite not thinking she had a headache, she felt immediate relief of head pressure and vision was brighter after LP. Her opening pressure was 35. 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 We agreed to stop nortriptyline  and start Diamox . Patient was unable to tolerate Diamox  due to nausea. We stopped this and started Topamax  on 06/04/22. This was increased to 50 mg BID on 08/06/22. This was stopped around 09/2022 due to tingling and cough. Since stopping topamax , she has noticed duller/decreased vision that is slowly building up like it was prior to LP. We discussed other treatment options, which really only Lasix  remains medically. Her other options would be serial lumbar punctures or surgical intervention such as shunt or stent, none of which would be ideal. She is not interested in retrying Diamox  or Topamax .  In terms of migraines, Sumatriptan  helps with her headaches that she has about twice a month.  Plan: -Start lasix  20 mg daily -Continue Sumatriptan  100 mg PRN at onset of headache. Can repeat after 2 hours -Limit use of pain relievers to no more than 2 days out of week to prevent risk of rebound or medication-overuse headache. -Will repeat CTA head for aneurysm monitoring in 1 year (~09/2023)  Return to clinic in 3 months  Total time spent reviewing records, interview, history/exam, documentation, and coordination of care on day of encounter:  30 min  Venetia Potters, MD

## 2023-02-13 ENCOUNTER — Ambulatory Visit: Payer: BC Managed Care – PPO | Admitting: Neurology

## 2023-02-13 ENCOUNTER — Encounter: Payer: Self-pay | Admitting: Neurology

## 2023-02-13 VITALS — BP 133/80 | HR 85 | Ht 64.0 in | Wt 277.0 lb

## 2023-02-13 DIAGNOSIS — G43009 Migraine without aura, not intractable, without status migrainosus: Secondary | ICD-10-CM | POA: Diagnosis not present

## 2023-02-13 DIAGNOSIS — H538 Other visual disturbances: Secondary | ICD-10-CM

## 2023-02-13 DIAGNOSIS — F40298 Other specified phobia: Secondary | ICD-10-CM

## 2023-02-13 DIAGNOSIS — Z72820 Sleep deprivation: Secondary | ICD-10-CM

## 2023-02-13 DIAGNOSIS — R0683 Snoring: Secondary | ICD-10-CM

## 2023-02-13 DIAGNOSIS — G932 Benign intracranial hypertension: Secondary | ICD-10-CM

## 2023-02-13 DIAGNOSIS — H53149 Visual discomfort, unspecified: Secondary | ICD-10-CM

## 2023-02-13 DIAGNOSIS — I671 Cerebral aneurysm, nonruptured: Secondary | ICD-10-CM

## 2023-02-13 DIAGNOSIS — Z8249 Family history of ischemic heart disease and other diseases of the circulatory system: Secondary | ICD-10-CM

## 2023-02-13 DIAGNOSIS — R110 Nausea: Secondary | ICD-10-CM

## 2023-02-13 MED ORDER — FUROSEMIDE 20 MG PO TABS
20.0000 mg | ORAL_TABLET | Freq: Every day | ORAL | 3 refills | Status: DC
Start: 2023-02-13 — End: 2023-05-18

## 2023-02-13 NOTE — Patient Instructions (Signed)
 We will start Lasix  20 mg daily today for IIH and see if this works for you.  If it does or doesn't, please let me know in about 1 month. We may have to consider neurosurgery consult if none of the medications work for you.  For your migraines, continue Sumatriptan  100 mg as needed at headache onset, can repeat after 2 hours if needed.  We will monitor your aneurysm with yearly scans (next around 09/2023).  I will see you back in about 3 months or sooner if needed.  If you have vision loss, this is an emergency and you need to go to nearest emergency room.  Please let me know if you have any questions or concerns in the meantime.  The physicians and staff at [facility] are committed to providing excellent care. You may receive a survey requesting feedback about your experience at our office. We strive to receive very good responses to the survey questions. If you feel that your experience would prevent you from giving the office a very good  response, please contact our office to try to remedy the situation. We may be reached at [phone]. Thank you for taking the time out of your busy day to complete the survey.  Venetia Potters, MD [facility]

## 2023-03-17 ENCOUNTER — Encounter: Payer: Self-pay | Admitting: Neurology

## 2023-03-24 ENCOUNTER — Telehealth: Payer: Self-pay | Admitting: Neurology

## 2023-03-24 NOTE — Telephone Encounter (Signed)
I attempted to call patient to see how she is doing with Lasix for IIH.   I left her a message. I had previously also sent her a MyChart message, which has not been read.  I asked for a call back or respond to MyChart message.  Jacquelyne Balint, MD [facility]

## 2023-05-16 ENCOUNTER — Other Ambulatory Visit: Payer: Self-pay | Admitting: Neurology

## 2023-05-16 DIAGNOSIS — G932 Benign intracranial hypertension: Secondary | ICD-10-CM

## 2023-05-19 NOTE — Progress Notes (Signed)
 NEUROLOGY FOLLOW UP OFFICE NOTE  MENDE BISWELL [id number]  Subjective:  JOSSLYNN MENTZER is a 58 y.o. year old right-handed female with a medical history of HLD, IBS, asthma, hypothyroidism, kidney stones who we last saw on 02/13/23 for IIH.  To briefly review: 04/02/22: Patient started having "migraines" when going through menopause (~2017). They were located on the right side, with pressure behind the eyes, with photophobia and phonophobia, and dizziness when standing. She denies nausea or vomiting. She would have maybe 1 per month. Over the last year (2023), they were more frequent, about 1 headache every 1-2 weeks.    Over the last 6 months (beginning at the end of 2023), they also became more intense and frequent. She also noticed post-orgasmic headache with a sharp pain on the right side of head that would last 20-30 seconds followed by an intense typically migraine. They now happen even without orgasm. She can have word finding problems, blurry vision. She does not have clear vision loss, but does think she requires more light to see than previous.   She denies any headache aura.   She prefers to lay in a cold, dark, room when she gets a headache. She denies worsening of symptoms with position (stay the same when she lays down). Her headaches do not get worse with baring down (such as with bowel movements). She denies jaw claudication or loss of vision.   She takes ibuprofen daily for headaches. She has never been on headache medications.   Patient has a family history of cerebral aneurysm. Paternal grandfather died from brain aneurysm in his 33s.   MRI brain on 03/18/22 showed a partially empty sella. 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   Patient takes pellets testosterone and estrogen.    She drinks 1 regular cup of coffee daily. She will occasionally drink a soda. She drinks 2 cocktails per week. She does not smoke.   She takes vit D and a  probiotic.   She saw her eye doctor about 1 year ago and is planning to go again soon.   05/01/22: B12 was borderline low (256), so supplementation was recommended (1000 mcg daily). She is taking that.   ESR was elevated to 52.    Patient has had 1 headache since last visit. She is only on Nortriptyline  10 mg qhs. She never went to 20 mg. When she had one headache, she took imitrex  and headache quickly resolved. She has not had any headaches with orgasm.   Patient has appointment with her eye doctor on Monday.   Patient mentions minor cognitive difficulties. She endorses poor sleep. She does not feel rested when she wakes. She feels very tired in the afternoon. She does snore. She has never had a sleep study.   08/06/22: Patient was seen by Dr. Rachel Budds in ophtho who found possible papilledema and recommended LP with opening pressure. Patient had LP on 05/29/22. Despite not thinking she had a headache, she felt immediate relief of head pressure and vision was brighter after LP. Her opening pressure was 35. 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 We agreed to stop nortriptyline  and start Diamox . Patient was unable to tolerate Diamox  due to nausea. We stopped this and started Topamax  on 06/04/22 (25 mg BID).   Patient called describing floaters and a metallic smell or engine exhaust smell. She also endorsed fluctuating headaches. We discussed medication changes, but patient preferred to monitor. Patient went on vacation about  2-3 weeks ago and her symptoms resolved.    Patient had a bad headache about 2 weeks ago. It did not respond to sumatriptan  x2. Patient has only had about 3 headaches since she was last seen in clinic (about 1 per month). She thinks baring down makes the headache worse, but not so much when laying down. Her vision is darker than after when she first had her LP.   Her sleep study showed mild OSA. She had also lost 15 pounds, but gained it back since vacation.  I increased topamax   to 50 mg BID on 08/06/22.  02/13/23: She is getting about 2 headaches per month. They are severe when she gets them. She woke up with it today. Sumatriptan  resolves her headaches within a couple of hours. It makes her sleepy but when she wakes, she is a little hung over feeling, but no headache.   When she increased topamax  to 50 mg BID, she had tingling in her fingers and toes and swelling in them. She dropped the topamax  to 25 mg BID and the tingling stopped. She had a continued cough though. She stopped the topamax  at the end of 09/2022, then the cough went away.    She notices duller vision since being off the topamax , except when she has a headache and has photophobia (migraine).   Current medications:  Sumatriptan  100 mg PRN - couple times per month (no side effects)   CTA of head for aneurysm monitoring was unchanged from prior.  Most recent Assessment and Plan (02/13/23): This is KEEGHAN MCINTIRE, a 58 y.o. 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   Patient had LP on 05/29/22. Despite not thinking she had a headache, she felt immediate relief of head pressure and vision was brighter after LP. Her opening pressure was 35. 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 We agreed to stop nortriptyline  and start Diamox . Patient was unable to tolerate Diamox  due to nausea. We stopped this and started Topamax  on 06/04/22. This was increased to 50 mg BID on 08/06/22. This was stopped around 09/2022 due to tingling and cough. Since stopping topamax , she has noticed duller/decreased vision that is slowly building up like it was prior to LP. We discussed other treatment options, which really only Lasix  remains medically. Her other options would be serial lumbar punctures or surgical intervention such as shunt or stent, none of which would be ideal. She is not interested in retrying Diamox  or Topamax .   In terms of migraines,  Sumatriptan  helps with her headaches that she has about twice a month.   Plan: -Start lasix  20 mg daily -Continue Sumatriptan  100 mg PRN at onset of headache. Can repeat after 2 hours -Limit use of pain relievers to no more than 2 days out of week to prevent risk of rebound or medication-overuse headache. -Will repeat CTA head for aneurysm monitoring in 1 year (~09/2023)  Since their last visit: I tried to call and send MyChart message to check on patient (03/17/23 and 03/24/23), but there was no response to my MyChart or voicemail.  She continues to take lasix  20 mg daily. She has good days and bad days. She continues to have vision problems. She has some blurry vision and requires more light. She denies vision loss. She sees her eye doctor at the end of July. Overall, her symptoms her stable. She is having about 2 severe headaches per month.  She will have milder headaches a couple of times per week. She will take sumatriptan  and this usually helps.  She still gets occasional pain in head during sex, while going to bathroom, or bending over to tie her shoe.  MEDICATIONS:  Outpatient Encounter Medications as of 05/28/2023  Medication Sig   albuterol  (PROVENTIL  HFA;VENTOLIN  HFA) 108 (90 BASE) MCG/ACT inhaler Inhale 1-2 puffs into the lungs every 6 (six) hours as needed for wheezing or shortness of breath.    atorvastatin  (LIPITOR) 40 MG tablet Take 1 tablet (40 mg total) by mouth daily. Please call our office to schedule an overdue appointment with Dr. Micael Adas before anymore refills. [phone]. Thank you 2nd attempt   furosemide  (LASIX ) 20 MG tablet TAKE 1 TABLET BY MOUTH EVERY DAY   Naproxen (NAPROSYN PO) Take 1 capsule by mouth 3 (three) times daily as needed.   SUMAtriptan  (IMITREX ) 100 MG tablet Take 1 tablet (100 mg total) by mouth once as needed for up to 1 dose for migraine. May repeat in 2 hours if headache persists or recurs.   Testosterone 100 MG PLLT Take 100 mg by mouth See admin  instructions.   Testosterone 50 MG PLLT Take 50 mg by mouth See admin instructions.   ESTRADIOL PO Take 10 mg by mouth See admin instructions. (Patient not taking: Reported on 05/21/2022)   liothyronine (CYTOMEL) 5 MCG tablet Take 10 mcg by mouth daily. (Patient not taking: Reported on 05/28/2023)   zolpidem  (AMBIEN ) 5 MG tablet Take 1 tablet (5 mg total) by mouth at bedtime as needed for sleep. (Patient not taking: Reported on 02/13/2023)   No facility-administered encounter medications on file as of 05/28/2023.    PAST MEDICAL HISTORY: Past Medical History:  Diagnosis Date   Asthma    Allegery induced   Complication of anesthesia 11/05/11    Pt re-intubated in OR due to residual muscle weakness   Constipation    Coronary artery calcification seen on CAT scan    Cough 07/21/2010   Dysmenorrhea 01/31/2016   Eczema    Headache    migraine   History of kidney stones    Hyperlipidemia    Insulin-resistant diabetes mellitus and acanthosis nigricans    diet controlled does not do CBGs   Left ureteral calculus    Nausea & vomiting    Upper respiratory infection    Ureter, calculus 11/05/2011   URI (upper respiratory infection) 03/10/2011    PAST SURGICAL HISTORY: Past Surgical History:  Procedure Laterality Date   BACK SURGERY  02/02/14   L5- S1 discectomy   CESAREAN SECTION  2002 &  2005   x2   CHOLECYSTECTOMY N/A 10/06/2014   Procedure: LAPAROSCOPIC CHOLECYSTECTOMY WITH INTRAOPERATIVE CHOLANGIOGRAM;  Surgeon: Lillette Reid III, MD;  Location: [facility];  Service: General;  Laterality: N/A;   CYSTO/ RIGHT RETROGRADE PYELOGRAM/ RIGHT URETERAL STENT PLACEMENT  10-28-2010   RIGHT RENAL STONE   CYSTOSCOPY WITH URETEROSCOPY  11/05/2011   Procedure: CYSTOSCOPY WITH URETEROSCOPY;  Surgeon: Livingston Rigg, MD;  Location[facility];  Service: Urology;  Laterality: Left;  URETEROSCOPY , STONE EXTRACTION POSSIBLE BASKET RETROGRADE AND HOLMIUM LASER AND POSSIBLE STENT   C ARM  LASER     DILITATION & CURRETTAGE/HYSTROSCOPY WITH NOVASURE ABLATION N/A 09/09/2013   Procedure: DILATATION & CURETTAGE/HYSTEROSCOPY WITH NOVASURE ABLATION;  Surgeon: Camillo Celestine, MD;  Location: [facility];  Service: Gynecology;  Laterality: N/A;   ROBOTIC ASSISTED TOTAL HYSTERECTOMY WITH SALPINGECTOMY Bilateral 01/31/2016   Procedure: ROBOTIC  ASSISTED TOTAL HYSTERECTOMY WITH SALPINGECTOMY;  Surgeon: Meriam Stamp, MD;  Location: [facility];  Service: Gynecology;  Laterality: Bilateral;    ALLERGIES: No Known Allergies  FAMILY HISTORY: Family History  Problem Relation Age of Onset   Heart disease Mother    Atrial fibrillation Mother    Mitral valve prolapse Mother     SOCIAL HISTORY: Social History   Tobacco Use   Smoking status: Never   Smokeless tobacco: Never  Vaping Use   Vaping status: Never Used  Substance Use Topics   Alcohol use: No    Alcohol/week: 3.0 standard drinks of alcohol    Types: 2 Glasses of wine, 1 Standard drinks or equivalent per week    Comment: ocassional   Drug use: No   Social History   Social History Narrative   Are you right handed or left handed? Right   Are you currently employed ? 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   Who lives with you? Husband and 2 children   What type of home do you live in: 1 story or 2 story? two   Caffeine 1 cup       Objective:  Vital Signs:  BP 128/83   Pulse 89   Ht 5\' 4"  (1.626 m)   Wt 286 lb (129.7 kg)   LMP 01/24/2016 (Exact Date)   SpO2 98%   BMI 49.09 kg/m   General: No acute distress.  Patient appears well-groomed.   Head:  Normocephalic/atraumatic Eyes:  fundi examined but not well visualized Neck: supple, no paraspinal tenderness, full range of motion Heart: regular rate and rhythm Lungs: Clear to auscultation bilaterally. Vascular: No carotid bruits.  Neurological Exam: Mental status: alert and oriented, speech fluent and not dysarthric, language  intact.  Cranial nerves: CN I: not tested CN II: pupils equal, round and reactive to light, visual fields intact CN III, IV, VI:  full range of motion, no nystagmus, no ptosis CN V: facial sensation intact. CN VII: upper and lower face symmetric CN VIII: hearing intact CN IX, X: uvula midline CN XI: sternocleidomastoid and trapezius muscles intact CN XII: tongue midline  Bulk & Tone: normal, no fasciculations. Motor:  muscle strength 5/5 throughout Deep Tendon Reflexes:  2+ throughout.   Sensation:  Light touch sensation intact. Finger to nose testing:  Without dysmetria.   Gait:  Normal station and stride.    Labs and Imaging review: No new results  Previously reviewed results: CSF 05/29/22: Opening pressure 35 0 RBC, 0 WBC, 46 protein, 61 glucose Myelin basic protein wnl ACE wnl IgG index normal   04/02/22: ESR: 52 B12: 256   Recent Labs           Lab Results  Component Value Date    HGBA1C 5.4 06/02/2017        Recent Labs[] Expand by Default           Lab Results  Component Value Date    TSH 2.500 06/02/2017        External labs: CMP (02/25/22): unremarkable HbA1c (02/22/21): 5.6 TSH( 03/2022): 1.687   MRI/MRA brain wo contrast (03/15/22): FINDINGS: MRI HEAD FINDINGS   Brain: There is no evidence of an acute infarct, intracranial hemorrhage, mass, midline shift, or extra-axial fluid collection. The ventricles and sulci are normal. No significant white matter disease is evident. An incidental developmental venous anomaly is noted in the right basal ganglia. There is a partially empty  sella. The cerebellar tonsils are normally positioned.   Vascular: Major intracranial vascular flow voids are preserved.   Skull and upper cervical spine: Unremarkable bone marrow signal.   Sinuses/Orbits: Unremarkable orbits. No significant inflammatory changes in the paranasal sinuses. Small right mastoid effusion.   Other: None.   MRA HEAD FINDINGS   Anterior  circulation: The internal carotid arteries are widely patent from skull base to carotid termini. There is a 2 mm protrusion from the right supraclinoid ICA in the posterior communicating region. The ACAs and MCAs are patent without evidence of a proximal branch occlusion or significant proximal stenosis.   Posterior circulation: The included distal portions of the intracranial vertebral arteries are patent to the basilar. The basilar artery is widely patent. Patent SCA origins are seen bilaterally. There may be diminutive posterior communicating arteries bilaterally. Both PCAs are patent without evidence of a significant proximal stenosis. No aneurysm is identified.   Anatomic variants: None.   IMPRESSION: 1. Partially empty sella, often an incidental finding though can be seen with intracranial hypertension. 2. Otherwise unremarkable appearance of the brain. 3. 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 4. Otherwise unremarkable head MRA.  CTA head (09/03/22): FINDINGS: CT HEAD   Brain: There is no mass, hemorrhage or extra-axial collection. The size and configuration of the ventricles and extra-axial CSF spaces are normal. The brain parenchyma is normal, without acute or chronic infarction.   Vascular: No abnormal hyperdensity of the major intracranial arteries or dural venous sinuses. No intracranial atherosclerosis.   Skull: The visualized skull base, calvarium and extracranial soft tissues are normal.   Sinuses/Orbits: No fluid levels or advanced mucosal thickening of the visualized paranasal sinuses. No mastoid or middle ear effusion. The orbits are normal.   CTA HEAD   POSTERIOR CIRCULATION:   --Vertebral arteries: Normal   --Inferior cerebellar arteries: Normal.   --Basilar artery: Normal.   --Superior cerebellar arteries: Normal.   --Posterior cerebral arteries: Normal.   ANTERIOR CIRCULATION:   --Intracranial internal carotid arteries:  There is a 2 mm inferiorly directed aneurysm arising from the right carotid terminus. There is mild atherosclerotic calcification of both internal carotid arteries.   --Anterior cerebral arteries (ACA): Normal.   --Middle cerebral arteries (MCA): Normal.   Review of the MIP images confirms the above findings.   IMPRESSION: 1. No emergent large vessel occlusion or high-grade stenosis of the intracranial arteries. 2. 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  Assessment/Plan:  This is SHAWNDREA RUTKOWSKI, a 58 y.o. 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 Aneurysm was unchanged on CTA from 08/2022.  Patient had LP on 05/29/22. Despite not thinking she had a headache, she felt immediate relief of head pressure and vision was brighter after LP. Her opening pressure was 35. 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 We agreed to stop nortriptyline  and start Diamox . Patient was unable to tolerate Diamox  due to nausea. We stopped this and started Topamax  on 06/04/22. This was increased to 50 mg BID on 08/06/22. This was stopped around 09/2022 due to tingling and cough. Since stopping topamax , she has noticed duller/decreased vision that is slowly building up like it was prior to LP. Lasix  20 mg daily was started on 02/13/23. She continues to have 2 severe headaches per month and about 10 milder headaches per month (total of 12).   Plan: -Increase lasix  to 40 mg daily -  Continue Sumatriptan  100 mg as needed at onset of headache. Can repeat after 2 hours. -May consider adding back nortriptyline  to help with headaches if increase in lasix  does not help -Will order CTA head for aneurysm monitoring, to be completed ~7-09/2023  Return to clinic in 6 months  Rommie Coats, MD

## 2023-05-28 ENCOUNTER — Ambulatory Visit: Payer: BC Managed Care – PPO | Admitting: Neurology

## 2023-05-28 ENCOUNTER — Encounter: Payer: Self-pay | Admitting: Neurology

## 2023-05-28 VITALS — BP 128/83 | HR 89 | Ht 64.0 in | Wt 286.0 lb

## 2023-05-28 DIAGNOSIS — H53149 Visual discomfort, unspecified: Secondary | ICD-10-CM | POA: Diagnosis not present

## 2023-05-28 DIAGNOSIS — G43009 Migraine without aura, not intractable, without status migrainosus: Secondary | ICD-10-CM

## 2023-05-28 DIAGNOSIS — G932 Benign intracranial hypertension: Secondary | ICD-10-CM

## 2023-05-28 DIAGNOSIS — F40298 Other specified phobia: Secondary | ICD-10-CM | POA: Diagnosis not present

## 2023-05-28 DIAGNOSIS — H538 Other visual disturbances: Secondary | ICD-10-CM

## 2023-05-28 DIAGNOSIS — I671 Cerebral aneurysm, nonruptured: Secondary | ICD-10-CM

## 2023-05-28 MED ORDER — SUMATRIPTAN SUCCINATE 100 MG PO TABS: 100.0000 mg | ORAL_TABLET | Freq: Once | ORAL | 5 refills | Status: AC | PRN

## 2023-05-28 MED ORDER — FUROSEMIDE 20 MG PO TABS: 40.0000 mg | ORAL_TABLET | Freq: Every day | ORAL | 3 refills | Status: AC

## 2023-05-28 NOTE — Patient Instructions (Addendum)
 We will increase your lasix  today to 40 mg daily.  Continue Sumatriptan  100 mg as needed at onset of headache. Can repeat after 2 hours if needed.  I refilled both of these medications today.  We can consider adding back nortriptyline  if you continue to have frequent headaches. Let me know if the increase in Lasix  does not help.  I am reordering the CTA for aneurysm monitoring. Please schedule this in July or August of 2025. I will be in touch when I have the results.  I will see you back in clinic in 6 months.   Please let me know if you have any questions or concerns in the meantime.   The physicians and staff at [facility] are committed to providing excellent care. You may receive a survey requesting feedback about your experience at our office. We strive to receive "very good" responses to the survey questions. If you feel that your experience would prevent you from giving the office a "very good " response, please contact our office to try to remedy the situation. We may be reached at [phone]. Thank you for taking the time out of your busy day to complete the survey.  Rommie Coats, MD [facility]

## 2023-08-21 ENCOUNTER — Encounter: Payer: Self-pay | Admitting: Neurology

## 2023-08-28 ENCOUNTER — Ambulatory Visit
Admission: RE | Admit: 2023-08-28 | Discharge: 2023-08-28 | Disposition: A | Discharge status: Home / Self Care | Source: Ambulatory Visit | Attending: Neurology | Admitting: Neurology

## 2023-08-28 DIAGNOSIS — H53149 Visual discomfort, unspecified: Secondary | ICD-10-CM

## 2023-08-28 DIAGNOSIS — F40298 Other specified phobia: Secondary | ICD-10-CM

## 2023-08-28 DIAGNOSIS — H538 Other visual disturbances: Secondary | ICD-10-CM

## 2023-08-28 DIAGNOSIS — G43009 Migraine without aura, not intractable, without status migrainosus: Secondary | ICD-10-CM

## 2023-08-28 DIAGNOSIS — I671 Cerebral aneurysm, nonruptured: Secondary | ICD-10-CM

## 2023-08-28 DIAGNOSIS — G932 Benign intracranial hypertension: Secondary | ICD-10-CM

## 2023-08-28 MED ORDER — IOPAMIDOL (ISOVUE-370) INJECTION 76%
75.0000 mL | Freq: Once | INTRAVENOUS | Status: AC | PRN
  Administered 2023-08-28: 75 mL via INTRAVENOUS

## 2023-09-04 ENCOUNTER — Ambulatory Visit: Payer: Self-pay | Admitting: Neurology

## 2023-11-16 ENCOUNTER — Telehealth: Payer: Self-pay

## 2023-11-16 NOTE — Telephone Encounter (Signed)
 Received reviewed [employer] letter from [facility]. The original was placed in the scan folder in B pod. A copy was made and placed in Katie Cobb's cabinet in B pod.

## 2023-11-20 NOTE — Progress Notes (Deleted)
 NEUROLOGY FOLLOW UP OFFICE NOTE  Tammy Velez [id number]  Subjective:  Tammy Velez is a 58 y.o. year old right-handed female with a medical history of HLD, IBS, asthma, hypothyroidism, kidney stones who we last saw on 05/28/23 for IIH and migraines.  To briefly review: 04/02/22: Patient started having migraines when going through menopause (~2017). They were located on the right side, with pressure behind the eyes, with photophobia and phonophobia, and dizziness when standing. She denies nausea or vomiting. She would have maybe 1 per month. Over the last year (2023), they were more frequent, about 1 headache every 1-2 weeks.    Over the last 6 months (beginning at the end of 2023), they also became more intense and frequent. She also noticed post-orgasmic headache with a sharp pain on the right side of head that would last 20-30 seconds followed by an intense typically migraine. They now happen even without orgasm. She can have word finding problems, blurry vision. She does not have clear vision loss, but does think she requires more light to see than previous.   She denies any headache aura.   She prefers to lay in a cold, dark, room when she gets a headache. She denies worsening of symptoms with position (stay the same when she lays down). Her headaches do not get worse with baring down (such as with bowel movements). She denies jaw claudication or loss of vision.   She takes ibuprofen daily for headaches. She has never been on headache medications.   Patient has a family history of cerebral aneurysm. Paternal grandfather died from brain aneurysm in his 30s.   MRI brain on 03/18/22 showed a partially empty sella. 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   Patient takes pellets testosterone and estrogen.    She drinks 1 regular cup of coffee daily. She will occasionally drink a soda. She drinks 2 cocktails per week. She does not smoke.   She  takes vit D and a probiotic.   She saw her eye doctor about 1 year ago and is planning to go again soon.   05/01/22: B12 was borderline low (256), so supplementation was recommended (1000 mcg daily). She is taking that.   ESR was elevated to 52.    Patient has had 1 headache since last visit. She is only on Nortriptyline  10 mg qhs. She never went to 20 mg. When she had one headache, she took imitrex  and headache quickly resolved. She has not had any headaches with orgasm.   Patient has appointment with her eye doctor on Monday.   Patient mentions minor cognitive difficulties. She endorses poor sleep. She does not feel rested when she wakes. She feels very tired in the afternoon. She does snore. She has never had a sleep study.   08/06/22: Patient was seen by Dr. Debarah in ophtho who found possible papilledema and recommended LP with opening pressure. Patient had LP on 05/29/22. Despite not thinking she had a headache, she felt immediate relief of head pressure and vision was brighter after LP. Her opening pressure was 35. 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 We agreed to stop nortriptyline  and start Diamox . Patient was unable to tolerate Diamox  due to nausea. We stopped this and started Topamax  on 06/04/22 (25 mg BID).   Patient called describing floaters and a metallic smell or engine exhaust smell. She also endorsed fluctuating headaches. We discussed medication changes, but patient preferred to monitor. Patient went on  vacation about 2-3 weeks ago and her symptoms resolved.    Patient had a bad headache about 2 weeks ago. It did not respond to sumatriptan  x2. Patient has only had about 3 headaches since she was last seen in clinic (about 1 per month). She thinks baring down makes the headache worse, but not so much when laying down. Her vision is darker than after when she first had her LP.   Her sleep study showed mild OSA. She had also lost 15 pounds, but gained it back since vacation.   I  increased topamax  to 50 mg BID on 08/06/22.   02/13/23: She is getting about 2 headaches per month. They are severe when she gets them. She woke up with it today. Sumatriptan  resolves her headaches within a couple of hours. It makes her sleepy but when she wakes, she is a little hung over feeling, but no headache.   When she increased topamax  to 50 mg BID, she had tingling in her fingers and toes and swelling in them. She dropped the topamax  to 25 mg BID and the tingling stopped. She had a continued cough though. She stopped the topamax  at the end of 09/2022, then the cough went away.    She notices duller vision since being off the topamax , except when she has a headache and has photophobia (migraine).   Current medications:  Sumatriptan  100 mg PRN - couple times per month (no side effects)   CTA of head for aneurysm monitoring was unchanged from prior.  I started Lasix  20 mg daily on 02/13/23.  05/28/23: I tried to call and send MyChart message to check on patient (03/17/23 and 03/24/23), but there was no response to my MyChart or voicemail.   She continues to take lasix  20 mg daily. She has good days and bad days. She continues to have vision problems. She has some blurry vision and requires more light. She denies vision loss. She sees her eye doctor at the end of July. Overall, her symptoms her stable. She is having about 2 severe headaches per month. She will have milder headaches a couple of times per week. She will take sumatriptan  and this usually helps.   She still gets occasional pain in head during sex, while going to bathroom, or bending over to tie her shoe.  Most recent Assessment and Plan (05/28/23): This is Tammy Velez, a 58 y.o. 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 Aneurysm was unchanged on CTA from 08/2022.   Patient had LP on 05/29/22. Despite not thinking she had a headache, she felt immediate relief  of head pressure and vision was brighter after LP. Her opening pressure was 35. 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 We agreed to stop nortriptyline  and start Diamox . Patient was unable to tolerate Diamox  due to nausea. We stopped this and started Topamax  on 06/04/22. This was increased to 50 mg BID on 08/06/22. This was stopped around 09/2022 due to tingling and cough. Since stopping topamax , she has noticed duller/decreased vision that is slowly building up like it was prior to LP. Lasix  20 mg daily was started on 02/13/23. She continues to have 2 severe headaches per month and about 10 milder headaches per month (total of 12).    Plan: -Increase lasix  to 40 mg daily -Continue Sumatriptan  100 mg as needed at onset of headache. Can repeat after 2 hours. -May consider adding back nortriptyline   to help with headaches if increase in lasix  does not help -Will order CTA head for aneurysm monitoring, to be completed ~7-09/2023  Since their last visit: CTA in 08/2023 was unchanged from prior.  Headaches? Vision loss?  Current medications?  MEDICATIONS:  Outpatient Encounter Medications as of 11/27/2023  Medication Sig   albuterol  (PROVENTIL  HFA;VENTOLIN  HFA) 108 (90 BASE) MCG/ACT inhaler Inhale 1-2 puffs into the lungs every 6 (six) hours as needed for wheezing or shortness of breath.    atorvastatin  (LIPITOR) 40 MG tablet Take 1 tablet (40 mg total) by mouth daily. Please call our office to schedule an overdue appointment with Dr. Shlomo before anymore refills. [phone]. Thank you 2nd attempt   ESTRADIOL PO Take 10 mg by mouth See admin instructions. (Patient not taking: Reported on 05/21/2022)   furosemide  (LASIX ) 20 MG tablet Take 2 tablets (40 mg total) by mouth daily.   Naproxen (NAPROSYN PO) Take 1 capsule by mouth 3 (three) times daily as needed.   SUMAtriptan  (IMITREX ) 100 MG tablet Take 1 tablet (100 mg total) by mouth once as needed for up to 1 dose for migraine. May repeat in 2 hours if  headache persists or recurs.   Testosterone 100 MG PLLT Take 100 mg by mouth See admin instructions.   Testosterone 50 MG PLLT Take 50 mg by mouth See admin instructions.   No facility-administered encounter medications on file as of 11/27/2023.    PAST MEDICAL HISTORY: Past Medical History:  Diagnosis Date   Asthma    Allegery induced   Complication of anesthesia 11/05/11    Pt re-intubated in OR due to residual muscle weakness   Constipation    Coronary artery calcification seen on CAT scan    Cough 07/21/2010   Dysmenorrhea 01/31/2016   Eczema    Headache    migraine   History of kidney stones    Hyperlipidemia    Insulin-resistant diabetes mellitus and acanthosis nigricans    diet controlled does not do CBGs   Left ureteral calculus    Nausea & vomiting    Upper respiratory infection    Ureter, calculus 11/05/2011   URI (upper respiratory infection) 03/10/2011    PAST SURGICAL HISTORY: Past Surgical History:  Procedure Laterality Date   BACK SURGERY  02/02/14   L5- S1 discectomy   CESAREAN SECTION  2002 &  2005   x2   CHOLECYSTECTOMY N/A 10/06/2014   Procedure: LAPAROSCOPIC CHOLECYSTECTOMY WITH INTRAOPERATIVE CHOLANGIOGRAM;  Surgeon: Deward Null III, MD;  Location: [facility];  Service: General;  Laterality: N/A;   CYSTO/ RIGHT RETROGRADE PYELOGRAM/ RIGHT URETERAL STENT PLACEMENT  10-28-2010   RIGHT RENAL STONE   CYSTOSCOPY WITH URETEROSCOPY  11/05/2011   Procedure: CYSTOSCOPY WITH URETEROSCOPY;  Surgeon: Alm GORMAN Fragmin, MD;  Location: [facility];  Service: Urology;  Laterality: Left;  URETEROSCOPY , STONE EXTRACTION POSSIBLE BASKET RETROGRADE AND HOLMIUM LASER AND POSSIBLE STENT   C ARM  LASER    DILITATION & CURRETTAGE/HYSTROSCOPY WITH NOVASURE ABLATION N/A 09/09/2013   Procedure: DILATATION & CURETTAGE/HYSTEROSCOPY WITH NOVASURE ABLATION;  Surgeon: Charlie JINNY Flowers, MD;  Location: [facility];  Service: Gynecology;  Laterality: N/A;   ROBOTIC ASSISTED TOTAL  HYSTERECTOMY WITH SALPINGECTOMY Bilateral 01/31/2016   Procedure: ROBOTIC ASSISTED TOTAL HYSTERECTOMY WITH SALPINGECTOMY;  Surgeon: Charlie Flowers, MD;  Location: [facility];  Service: Gynecology;  Laterality: Bilateral;    ALLERGIES: No Known Allergies  FAMILY HISTORY: Family History  Problem Relation Age of Onset   Heart disease Mother  Atrial fibrillation Mother    Mitral valve prolapse Mother     SOCIAL HISTORY: Social History   Tobacco Use   Smoking status: Never   Smokeless tobacco: Never  Vaping Use   Vaping status: Never Used  Substance Use Topics   Alcohol use: No    Alcohol/week: 3.0 standard drinks of alcohol    Types: 2 Glasses of wine, 1 Standard drinks or equivalent per week    Comment: ocassional   Drug use: No   Social History   Social History Narrative   Are you right handed or left handed? Right   Are you currently employed ? 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   Who lives with you? Husband and 2 children   What type of home do you live in: 1 story or 2 story? two   Caffeine 1 cup       Objective:  Vital Signs:  LMP 01/24/2016 (Exact Date)   ***  Labs and Imaging review: New results: CTA head and neck (08/28/23): IMPRESSION: Non-contrast head CT:   1.  No evidence of an acute intracranial abnormality. 2. Partially empty sella turcica. This finding can reflect incidental anatomic variation, or alternatively, it can be associated with chronic idiopathic intracranial hypertension (pseudotumor cerebri).   CTA head:   1. 2 mm infundibulum versus aneurysm arising from the supraclinoid right internal carotid artery, unchanged from the CTA of 09/03/2022. 2. No proximal intracranial large vessel occlusion or high-grade proximal arterial stenosis.  Previously reviewed results: CSF 05/29/22: Opening pressure 35 0 RBC, 0 WBC, 46 protein, 61 glucose Myelin basic protein wnl ACE wnl IgG index normal    04/02/22: ESR: 52 B12: 256   Recent Labs           Lab Results  Component Value Date    HGBA1C 5.4 06/02/2017        Recent Labs[] Expand by Default           Lab Results  Component Value Date    TSH 2.500 06/02/2017        External labs: CMP (02/25/22): unremarkable HbA1c (02/22/21): 5.6 TSH( 03/2022): 1.687   MRI/MRA brain wo contrast (03/15/22): FINDINGS: MRI HEAD FINDINGS   Brain: There is no evidence of an acute infarct, intracranial hemorrhage, mass, midline shift, or extra-axial fluid collection. The ventricles and sulci are normal. No significant white matter disease is evident. An incidental developmental venous anomaly is noted in the right basal ganglia. There is a partially empty sella. The cerebellar tonsils are normally positioned.   Vascular: Major intracranial vascular flow voids are preserved.   Skull and upper cervical spine: Unremarkable bone marrow signal.   Sinuses/Orbits: Unremarkable orbits. No significant inflammatory changes in the paranasal sinuses. Small right mastoid effusion.   Other: None.   MRA HEAD FINDINGS   Anterior circulation: The internal carotid arteries are widely patent from skull base to carotid termini. There is a 2 mm protrusion from the right supraclinoid ICA in the posterior communicating region. The ACAs and MCAs are patent without evidence of a proximal branch occlusion or significant proximal stenosis.   Posterior circulation: The included distal portions of the intracranial vertebral arteries are patent to the basilar. The basilar artery is widely patent. Patent SCA origins are seen bilaterally. There may be diminutive posterior communicating arteries bilaterally. Both PCAs are patent without evidence of a significant proximal stenosis. No aneurysm is identified.   Anatomic variants:  None.   IMPRESSION: 1. Partially empty sella, often an incidental finding though can be seen with intracranial  hypertension. 2. Otherwise unremarkable appearance of the brain. 3. 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 4. Otherwise unremarkable head MRA.   CTA head (09/03/22): FINDINGS: CT HEAD   Brain: There is no mass, hemorrhage or extra-axial collection. The size and configuration of the ventricles and extra-axial CSF spaces are normal. The brain parenchyma is normal, without acute or chronic infarction.   Vascular: No abnormal hyperdensity of the major intracranial arteries or dural venous sinuses. No intracranial atherosclerosis.   Skull: The visualized skull base, calvarium and extracranial soft tissues are normal.   Sinuses/Orbits: No fluid levels or advanced mucosal thickening of the visualized paranasal sinuses. No mastoid or middle ear effusion. The orbits are normal.   CTA HEAD   POSTERIOR CIRCULATION:   --Vertebral arteries: Normal   --Inferior cerebellar arteries: Normal.   --Basilar artery: Normal.   --Superior cerebellar arteries: Normal.   --Posterior cerebral arteries: Normal.   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 There is mild atherosclerotic calcification of both internal carotid arteries.   --Anterior cerebral arteries (ACA): Normal.   --Middle cerebral arteries (MCA): Normal.   Review of the MIP images confirms the above findings.   IMPRESSION: 1. No emergent large vessel occlusion or high-grade stenosis of the intracranial arteries. 2. 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  Assessment/Plan:  This is Tammy Velez, a 58 y.o. female with: ***   Plan: *** ***Repeat CTA in 1-2 years (08/2024 vs 08/2025)***  Return to clinic in ***  Total time spent reviewing records, interview, history/exam, documentation, and coordination of care on day of encounter:  ***  min  Venetia Potters, MD

## 2023-11-27 ENCOUNTER — Ambulatory Visit: Admitting: Neurology

## 2024-01-11 ENCOUNTER — Encounter: Payer: Self-pay | Admitting: Neurology

## 2024-02-09 ENCOUNTER — Other Ambulatory Visit: Payer: Self-pay

## 2024-02-09 ENCOUNTER — Emergency Department (HOSPITAL_COMMUNITY)
Admission: EM | Admit: 2024-02-09 | Discharge: 2024-02-09 | Disposition: A | Discharge status: Home / Self Care | Attending: Emergency Medicine | Admitting: Emergency Medicine

## 2024-02-09 ENCOUNTER — Encounter (HOSPITAL_COMMUNITY): Payer: Self-pay

## 2024-02-09 DIAGNOSIS — E119 Type 2 diabetes mellitus without complications: Secondary | ICD-10-CM | POA: Insufficient documentation

## 2024-02-09 DIAGNOSIS — I2510 Atherosclerotic heart disease of native coronary artery without angina pectoris: Secondary | ICD-10-CM | POA: Insufficient documentation

## 2024-02-09 DIAGNOSIS — M5441 Lumbago with sciatica, right side: Secondary | ICD-10-CM | POA: Insufficient documentation

## 2024-02-09 DIAGNOSIS — M545 Low back pain, unspecified: Secondary | ICD-10-CM | POA: Diagnosis present

## 2024-02-09 DIAGNOSIS — M5431 Sciatica, right side: Secondary | ICD-10-CM

## 2024-02-09 MED ORDER — OXYCODONE-ACETAMINOPHEN 5-325 MG PO TABS: 1.0000 | ORAL_TABLET | Freq: Four times a day (QID) | ORAL | 0 refills | Status: AC | PRN

## 2024-02-09 MED ORDER — LIDOCAINE 5 % EX PTCH
1.0000 | MEDICATED_PATCH | CUTANEOUS | Status: DC
  Administered 2024-02-09: 1 via TRANSDERMAL
  Filled 2024-02-09: qty 1

## 2024-02-09 MED ORDER — KETOROLAC TROMETHAMINE 30 MG/ML IJ SOLN
30.0000 mg | Freq: Once | INTRAMUSCULAR | Status: AC
  Administered 2024-02-09: 30 mg via INTRAMUSCULAR
  Filled 2024-02-09: qty 1

## 2024-02-09 MED ORDER — PREDNISONE 10 MG (21) PO TBPK: ORAL_TABLET | Freq: Every day | ORAL | 0 refills | Status: AC

## 2024-02-09 MED ORDER — OXYCODONE-ACETAMINOPHEN 5-325 MG PO TABS
1.0000 | ORAL_TABLET | Freq: Once | ORAL | Status: AC
  Administered 2024-02-09: 1 via ORAL
  Filled 2024-02-09: qty 1

## 2024-02-09 NOTE — ED Provider Notes (Signed)
 " Tammy Velez   CSN: [id number] Arrival date & time: 02/09/24  0154     Patient presents with: Hip Pain   Tammy Velez is a 59 y.o. female with past medical history of CAD, T2DM, HLD, kidney stone, BMI 49 presents to the Emergency Department for evaluation of right lower back pain that radiates down right leg that started at 2200 today.  Patient has a history of sciatica and feels similar distribution but worse in pain.  Was in the shower attempting to lift her leg up over the ledge when she had a shooting pain in her back.  Is being followed by [facility].  Recently had MRI for sciatica on 02/02/2024 but does not know the results of this.  Took Robaxin, ibuprofen prior to arrival without improvement of pain.  Had lumbar surgery 10 years ago.  Lumbar x-rays from 02/02/2024 Velez DDD most notable at L5-S1.  No urinary symptoms.  No history of IVDU nor malignancy.  Denies saddle paresthesia, urinary incontinence, fevers    Hip Pain       Prior to Admission medications  Medication Sig Start Date End Date Taking? Authorizing Provider  oxyCODONE -acetaminophen  (PERCOCET/ROXICET) 5-325 MG tablet Take 1 tablet by mouth every 6 (six) hours as needed for up to 5 days for severe pain (pain score 7-10). 02/09/24 02/14/24 Yes Minnie, Varie Machamer E, PA  predniSONE  (STERAPRED UNI-PAK 21 TAB) 10 MG (21) TBPK tablet Take by mouth daily. Take 6 tabs by mouth daily  for 2 days, then 5 tabs for 2 days, then 4 tabs for 2 days, then 3 tabs for 2 days, 2 tabs for 2 days, then 1 tab by mouth daily for 2 days 02/09/24  Yes Minnie Tinnie BRAVO, PA  albuterol  (PROVENTIL  HFA;VENTOLIN  HFA) 108 (90 BASE) MCG/ACT inhaler Inhale 1-2 puffs into the lungs every 6 (six) hours as needed for wheezing or shortness of breath.     [provider]  atorvastatin  (LIPITOR) 40 MG tablet Take 1 tablet (40 mg total) by mouth daily. Please call our office to schedule an overdue  appointment with Dr. Shlomo before anymore refills. [phone]. Thank you 2nd attempt 09/05/21   Shlomo Wilbert SAUNDERS, MD  ESTRADIOL PO Take 10 mg by mouth See admin instructions. Patient not taking: Reported on 05/21/2022 03/18/21   [provider]  furosemide  (LASIX ) 20 MG tablet Take 2 tablets (40 mg total) by mouth daily. 05/28/23   Leigh Venetia LITTIE, MD  Naproxen (NAPROSYN PO) Take 1 capsule by mouth 3 (three) times daily as needed.    [provider]  SUMAtriptan  (IMITREX ) 100 MG tablet Take 1 tablet (100 mg total) by mouth once as needed for up to 1 dose for migraine. May repeat in 2 hours if headache persists or recurs. 05/28/23   Leigh Venetia LITTIE, MD  Testosterone 100 MG PLLT Take 100 mg by mouth See admin instructions. 03/18/21   [provider]  Testosterone 50 MG PLLT Take 50 mg by mouth See admin instructions. 03/18/21   [provider]    Allergies: Acetazolamide     Review of Systems  Musculoskeletal:  Positive for back pain.    Updated Vital Signs BP 118/67 (BP Location: Right Arm)   Pulse 72   Temp 98.1 F (36.7 C) (Oral)   Resp 16   Ht 5' 4 (1.626 m)   Wt 129.7 kg   LMP 01/24/2016   SpO2 97%   BMI 49.08 kg/m  Physical Exam Vitals and nursing Velez reviewed.  Constitutional:      General: She is not in acute distress.    Appearance: Normal appearance.  HENT:     Head: Normocephalic and atraumatic.  Eyes:     Conjunctiva/sclera: Conjunctivae normal.  Cardiovascular:     Rate and Rhythm: Normal rate.     Pulses:          Dorsalis pedis pulses are 2+ on the right side and 2+ on the left side.  Pulmonary:     Effort: Pulmonary effort is normal. No respiratory distress.     Breath sounds: Normal breath sounds.  Musculoskeletal:     Cervical back: No bony tenderness.     Thoracic back: No bony tenderness.     Lumbar back: Tenderness (right) and bony tenderness present. Positive right straight leg raise test.  Skin:    Coloration: Skin  is not jaundiced or pale.  Neurological:     Mental Status: She is alert and oriented to person, place, and time. Mental status is at baseline.     Comments: Motor 5/5 and sensation 2/2 of BLE.     (all labs ordered are listed, but only abnormal results are displayed) Labs Reviewed - No data to display  EKG: None  Radiology: No results found.    Medications Ordered in the ED  oxyCODONE -acetaminophen  (PERCOCET/ROXICET) 5-325 MG per tablet 1 tablet (has no administration in time range)  lidocaine  (LIDODERM ) 5 % 1 patch (has no administration in time range)  ketorolac  (TORADOL ) 30 MG/ML injection 30 mg (has no administration in time range)                                    Medical Decision Making Risk Prescription drug management.   Patient presents to the ED for concern of lumbar back pain, this involves an extensive number of treatment options, and is a complaint that carries with it a high risk of complications and morbidity.  The differential diagnosis includes stone, UTI, cauda equina, spinal abscess, muscle strain, traumatic injury   Co morbidities that complicate the patient evaluation  See hpi   Additional history obtained:  Additional history obtained from  Nursing and Outside Medical Records   External records from outside source obtained and reviewed including triage RN Velez, [facility] Velez from 02/02/24    Medicines ordered and prescription drug management:  I ordered medication including Toradol , lidocaine  patch, Percocet, prednisone  for sciatica Reevaluation of the patient after these medicines showed that the patient improved I have reviewed the patients home medicines and have made adjustments as needed    Problem List / ED Course:  Sciatica Vital signs hemodynamically stable with no fever no tachycardia Sciatica along sciatic nerve distribution and similar to sciatica flare in past. No history of IVDU nor malignancy.  Low suspicion for  spinal abscess, malignancy Denies urinary incontinence out, saddle paresthesia.  Low suspicion for cauda equina.  Do not think that emergent imaging is required No urinary symptoms.  No intermittent pain.  Low suspicion for UTI, kidney stone Patient recently had MRI imaging and can follow-up with [facility] regarding results.  I attempted to see these results however I was unable to see results on Care Everywhere Provided Percocet, Toradol , lidocaine  patch here in Emergency Department for pain as well as prescription for Percocet, prednisone .  Patient has muscle relaxer at home and does not need this.  I discussed taking muscle relaxer and narcotic pain medicine several hours apart as they both have drowsy like properties Discussed strict return precautions to include symptoms of cauda equina and provided them on DC paperwork Patient has follow-up with [facility] in 1 week regarding sciatica   Reevaluation:  After the interventions noted above, I reevaluated the patient and found that they have :improved    Dispostion:  After consideration of the diagnostic results and the patients response to treatment, I feel that the patent would benefit from outpatient management symptomatic treatment.   Discussed ED workup, disposition, return to ED precautions with patient who expresses understanding agrees with plan.  All questions answered to their satisfaction.  They are agreeable to plan.  Discharge instructions provided on paperwork  Final diagnoses:  Sciatica of right side    ED Discharge Orders          Ordered    predniSONE  (STERAPRED UNI-PAK 21 TAB) 10 MG (21) TBPK tablet  Daily        02/09/24 0342    oxyCODONE -acetaminophen  (PERCOCET/ROXICET) 5-325 MG tablet  Every 6 hours PRN        02/09/24 0342             Minnie Tinnie BRAVO, PA 02/09/24 0350    Griselda Norris, MD 02/09/24 [phone]  "

## 2024-02-09 NOTE — ED Triage Notes (Addendum)
 Pt POV with husband d/t right hip that radiates down right leg onset 2200.  Pt has hx of sciatica.   Had Robaxin @ 2300 Ibuprofen 800 mg @ 2300  None helped

## 2024-02-09 NOTE — Discharge Instructions (Addendum)
 Thank you for letting us  evaluate you today.  Your symptoms seem consistent with sciatica.  Have given you a Toradol  shot, lidocaine  patch, Percocet here in the emergency department.  I have also sent Percocet, steroid burst pack to your pharmacy.  You can take your next Percocet in next 6 hours.  Do not take Tylenol  with Percocet as it has Tylenol  in it.  You may take ibuprofen in addition for breakthrough pain.  You may also pick up lidocaine  patches, naproxen for topical pain relief.  I would recommend ice, heating packs.  Continue stretching.  I also recommend taking Percocet and muscle relaxer 3+ hours apart as they both can make you drowsy  Return to Emergency Department you experience urinary incontinence where you did not know you have to urinate and you pee on yourself similar to water  breaking, loss of sensation in genital region, worsening symptoms

## 2024-04-14 ENCOUNTER — Ambulatory Visit: Admitting: Neurology
